# Patient Record
Sex: Female | Born: 1970 | Race: White | Hispanic: No | Smoking: Never smoker
Health system: Southern US, Community
[De-identification: ages and names within clinical notes are randomized; demographics above are authoritative.]

## PROBLEM LIST (undated history)

## (undated) DIAGNOSIS — R112 Nausea with vomiting, unspecified: Secondary | ICD-10-CM

## (undated) DIAGNOSIS — C801 Malignant (primary) neoplasm, unspecified: Secondary | ICD-10-CM

## (undated) DIAGNOSIS — F419 Anxiety disorder, unspecified: Secondary | ICD-10-CM

## (undated) DIAGNOSIS — Z8489 Family history of other specified conditions: Secondary | ICD-10-CM

## (undated) DIAGNOSIS — Q8789 Other specified congenital malformation syndromes, not elsewhere classified: Secondary | ICD-10-CM

## (undated) DIAGNOSIS — Z9889 Other specified postprocedural states: Secondary | ICD-10-CM

## (undated) DIAGNOSIS — K219 Gastro-esophageal reflux disease without esophagitis: Secondary | ICD-10-CM

## (undated) DIAGNOSIS — R519 Headache, unspecified: Secondary | ICD-10-CM

## (undated) DIAGNOSIS — J189 Pneumonia, unspecified organism: Secondary | ICD-10-CM

## (undated) DIAGNOSIS — J45909 Unspecified asthma, uncomplicated: Secondary | ICD-10-CM

## (undated) DIAGNOSIS — R51 Headache: Secondary | ICD-10-CM

## (undated) DIAGNOSIS — J328 Other chronic sinusitis: Secondary | ICD-10-CM

## (undated) HISTORY — PX: BOTOX INJECTION: SHX5754

## (undated) HISTORY — PX: BREAST SURGERY: SHX581

## (undated) HISTORY — PX: MELANOMA EXCISION: SHX5266

---

## 1994-08-24 HISTORY — PX: DIAGNOSTIC LAPAROSCOPY: SUR761

## 2001-08-24 HISTORY — PX: DIAGNOSTIC LAPAROSCOPY: SUR761

## 2003-08-25 HISTORY — PX: MYOMECTOMY: SHX85

## 2006-05-15 ENCOUNTER — Encounter: Admission: RE | Admit: 2006-05-15 | Discharge: 2006-05-15 | Payer: Self-pay | Admitting: Neurology

## 2006-09-17 ENCOUNTER — Emergency Department (HOSPITAL_COMMUNITY): Admission: EM | Admit: 2006-09-17 | Discharge: 2006-09-17 | Payer: Self-pay | Admitting: Emergency Medicine

## 2006-10-15 ENCOUNTER — Ambulatory Visit: Payer: Self-pay

## 2007-03-16 ENCOUNTER — Ambulatory Visit: Payer: Self-pay | Admitting: Unknown Physician Specialty

## 2007-05-23 ENCOUNTER — Ambulatory Visit: Payer: Self-pay | Admitting: Chiropractic Medicine

## 2007-09-26 ENCOUNTER — Ambulatory Visit: Payer: Self-pay | Admitting: Family Medicine

## 2007-12-16 ENCOUNTER — Ambulatory Visit: Payer: Self-pay | Admitting: Otolaryngology

## 2009-04-25 ENCOUNTER — Ambulatory Visit: Payer: Self-pay

## 2009-05-02 ENCOUNTER — Ambulatory Visit: Payer: Self-pay

## 2010-12-17 ENCOUNTER — Emergency Department: Payer: Self-pay | Admitting: Emergency Medicine

## 2011-02-17 ENCOUNTER — Encounter: Payer: Self-pay | Admitting: Internal Medicine

## 2011-02-22 ENCOUNTER — Encounter: Payer: Self-pay | Admitting: Internal Medicine

## 2011-03-11 ENCOUNTER — Ambulatory Visit: Payer: Self-pay | Admitting: Otolaryngology

## 2011-03-25 ENCOUNTER — Encounter: Payer: Self-pay | Admitting: Internal Medicine

## 2011-04-21 ENCOUNTER — Ambulatory Visit: Payer: Self-pay | Admitting: Family Medicine

## 2011-04-22 ENCOUNTER — Ambulatory Visit: Payer: Self-pay | Admitting: Family Medicine

## 2011-04-25 ENCOUNTER — Encounter: Payer: Self-pay | Admitting: Internal Medicine

## 2011-05-25 ENCOUNTER — Encounter: Payer: Self-pay | Admitting: Internal Medicine

## 2012-01-19 IMAGING — CT CT ABD-PELV W/O CM
1 of 2 series · 15 of 32 positions shown, 19 images · non-contrast
Comparison: none

REASON FOR EXAM: STAT CR [id number] hematuria back pain eval kidney
stones
COMMENTS:

[Series 2: stone 3.0 i40f 3 · axial · 0.78mm/px · z∈[-530,-167]mm · 15 of 136 slices shown, 19 images]
[im 10/136  soft-tissue]
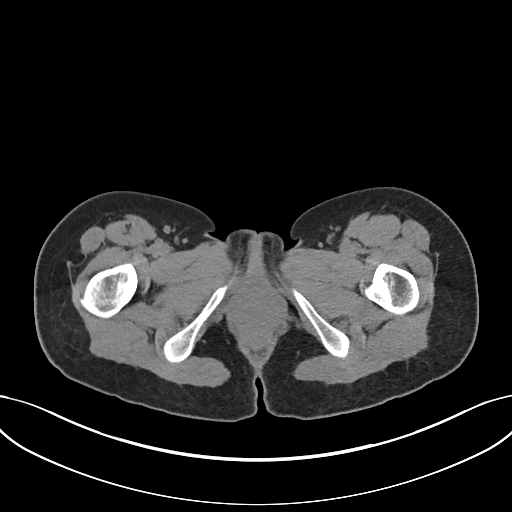
[im 10/136  bone]
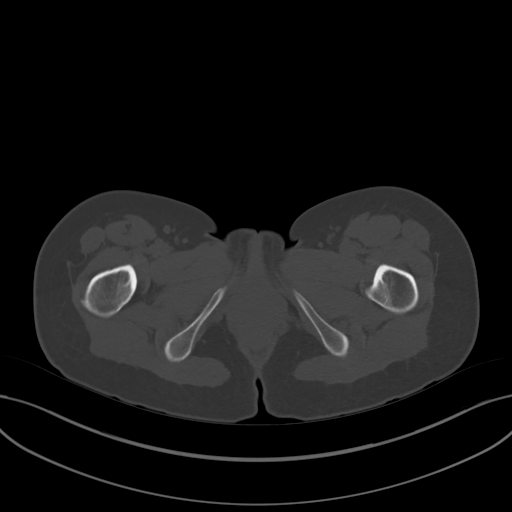
[im 20/136  soft-tissue]
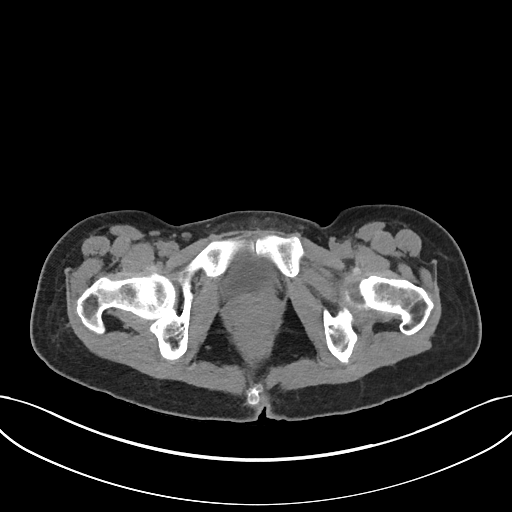
[im 29/136  soft-tissue]
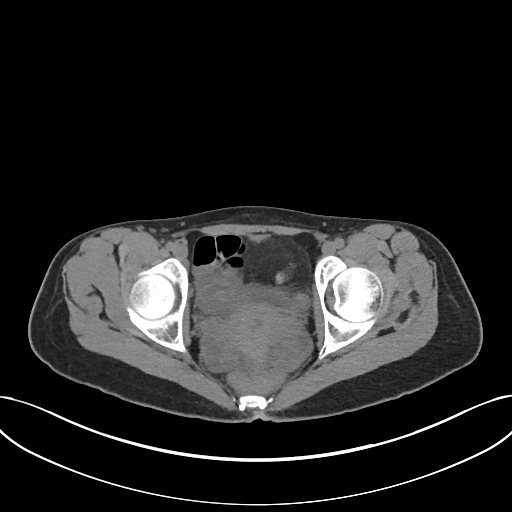
[im 39/136  soft-tissue]
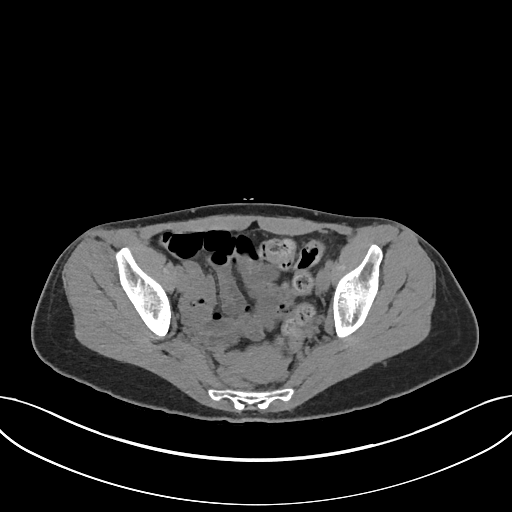
[im 49/136  soft-tissue]
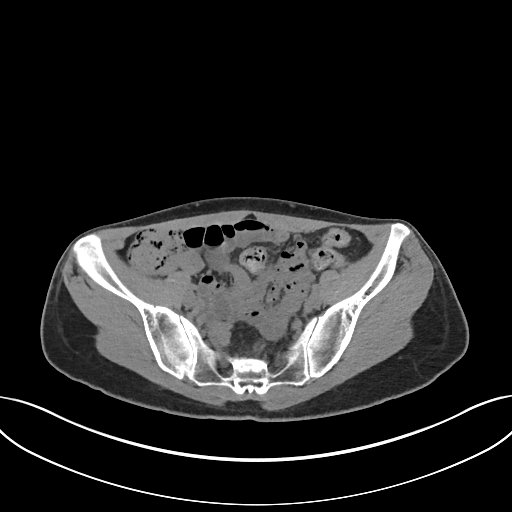
[im 58/136  soft-tissue]
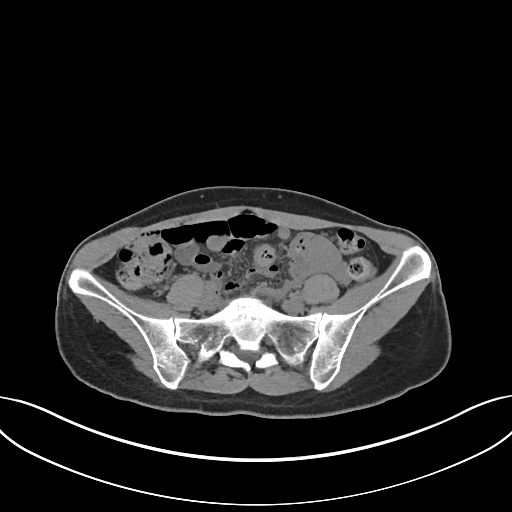
[im 68/136  soft-tissue]
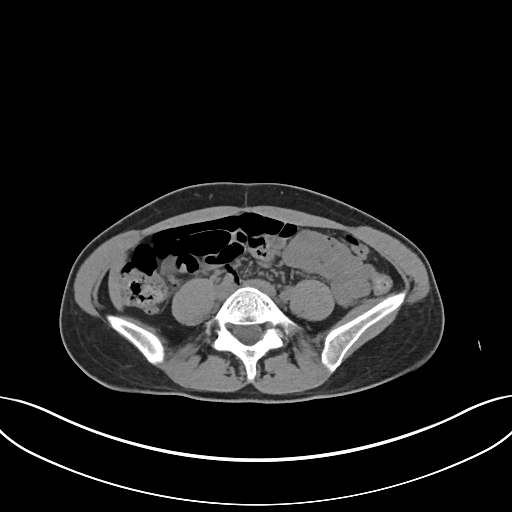
[im 78/136  soft-tissue]
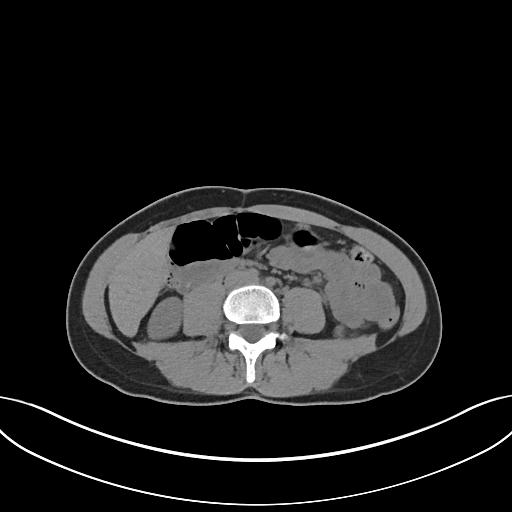
[im 87/136  soft-tissue]
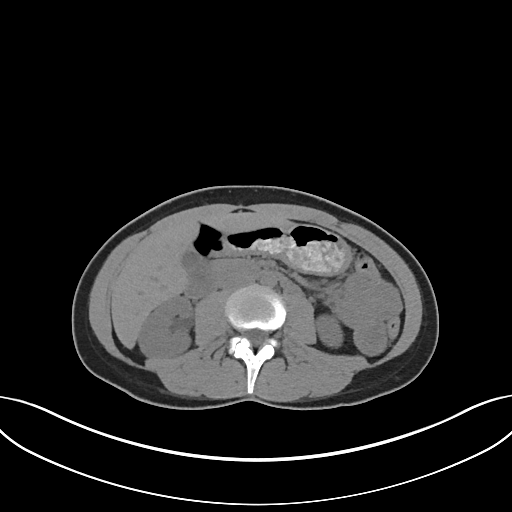
[im 87/136  bone]
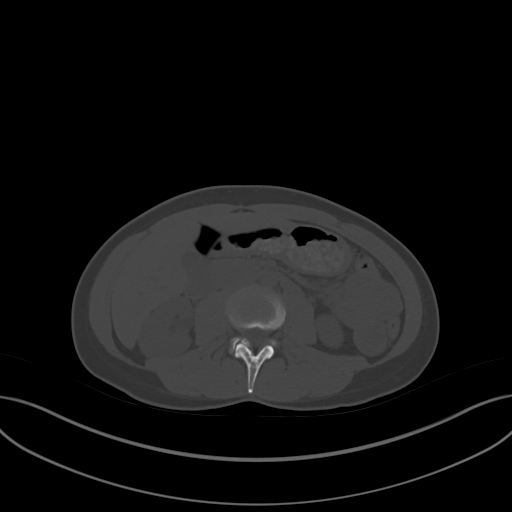
[im 97/136  soft-tissue]
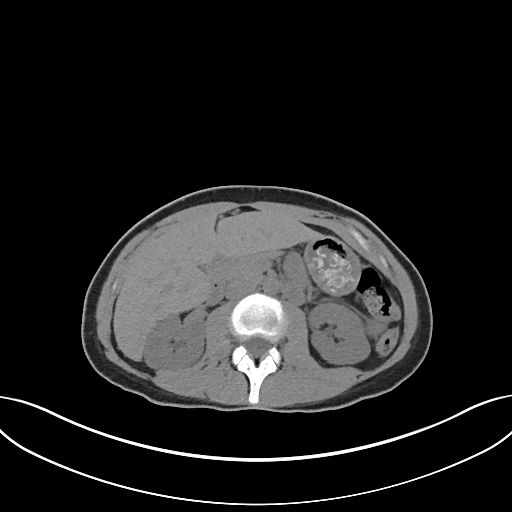
[im 107/136  soft-tissue]
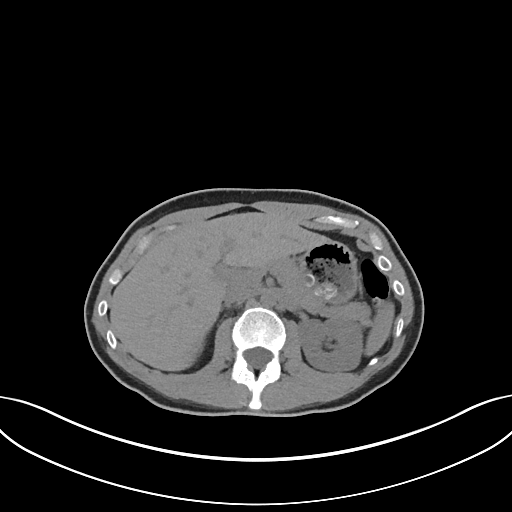
[im 116/136  soft-tissue]
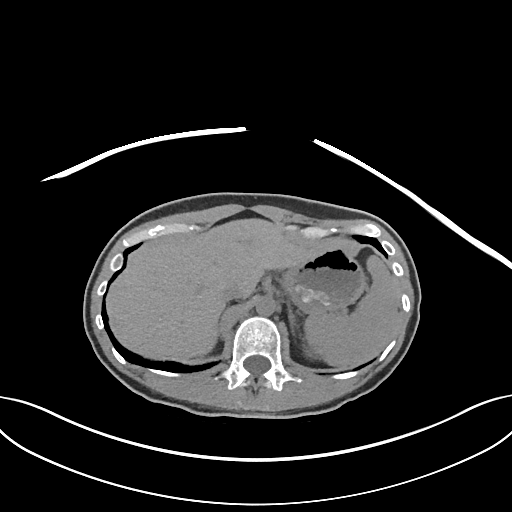
[im 116/136  lung]
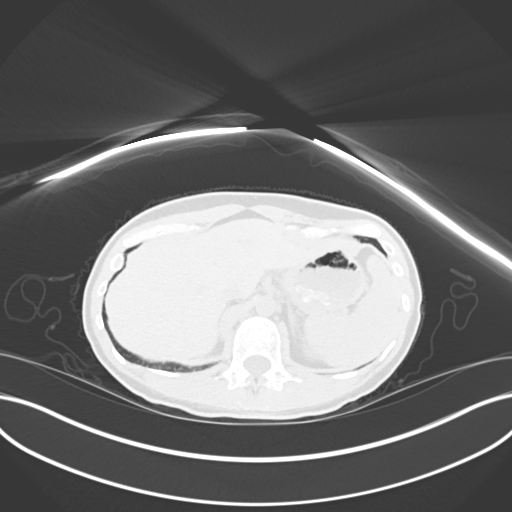
[im 121/136  lung]
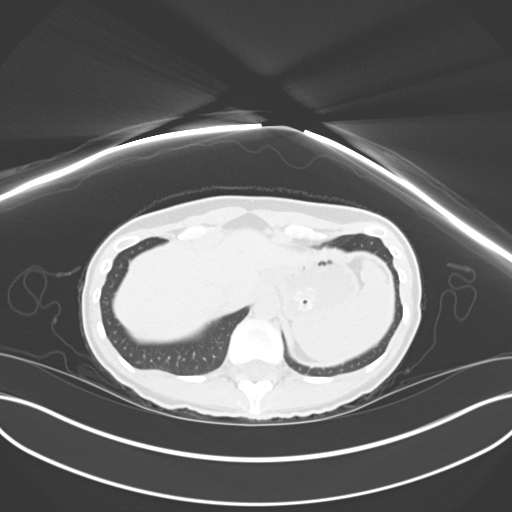
[im 126/136  soft-tissue]
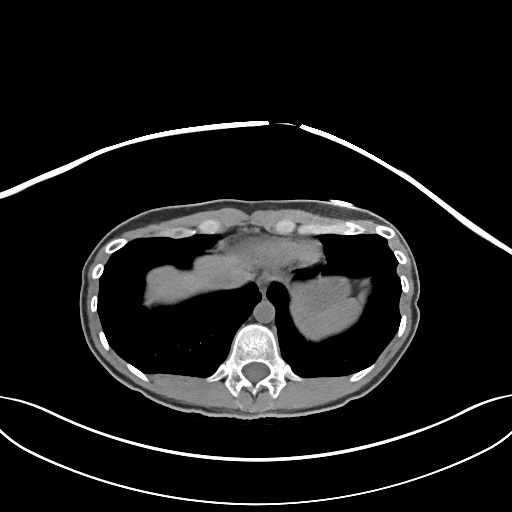
[im 126/136  lung]
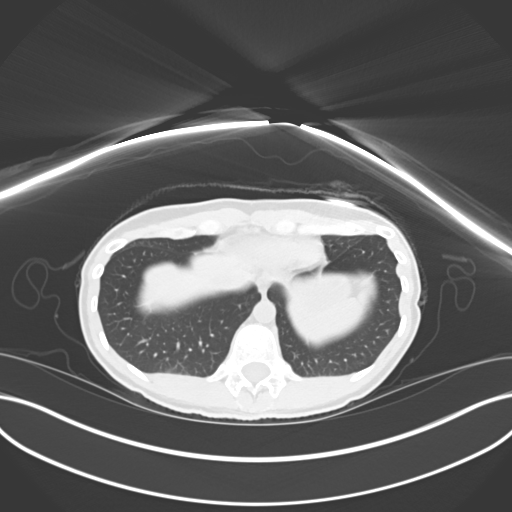
[im 131/136  lung]
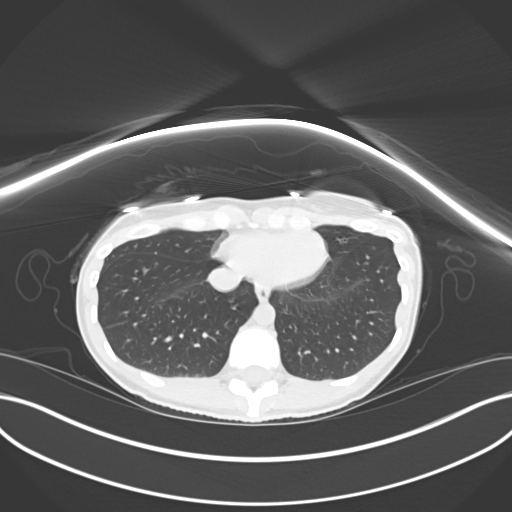

[15 of 32 positions shown; findings below may reference images not displayed]

PROCEDURE:     KCT - KCT ABDOMEN/PELVIS WO  - April 21, 2011  [DATE]

RESULT:     Axial noncontrast CT scanning was performed through the abdomen
and pelvis at 3 mm intervals and slice thicknesses. Review of multiplanar
reconstructed images was performed separately on the VIA monitor.

The study is limited without oral or intravenous contrast material. The
kidneys are normal in contour and exhibit no evidence of calcified stones
nor evidence of obstruction. The partially distended urinary bladder is
normal in appearance. There are loops of unopacified small bowel in the
pelvis but there do appear to be cystic adnexal processes bilaterally. I see
no free pelvic fluid. The uterus is grossly normal for the noncontrast study.

The liver, gallbladder, spleen, partially distended stomach, pancreas, and
adrenal glands are normal in appearance. There is radiodense material within
the stomach which may reflect medication. The caliber of the abdominal aorta
is normal. The lumbar vertebral bodies are preserved in height. The lung
bases exhibit emphysematous changes.
IMPRESSION: 1. I do not see evidence of urinary tract stones or urinary tract
obstruction. The perinephric fat is normal in density. No abnormality of the
urinary bladder is demonstrated.
2. There is soft tissue fullness in likely cystic adnexal processes
bilaterally. The uterus appears retroverted. Pelvic ultrasound is
recommended.
3. I see no evidence of bowel obstruction nor ileus. A normal appendix is
demonstrated.
4. I do not see acute hepatobiliary abnormality.

A preliminary report was called by me to do primary care and report left on
the triage nurse's voicemail at [DATE] p.m. on 21 April, 2011.

## 2012-01-20 IMAGING — US TRANSABDOMINAL ULTRASOUND OF PELVIS
1 series · 17 of 25 positions shown · non-contrast
Comparison: none

REASON FOR EXAM: pelvic pain
COMMENTS:

[Series 1: transabdominal ultrasound of pelvis · 17 of 32 slices shown]
[im 1/32]
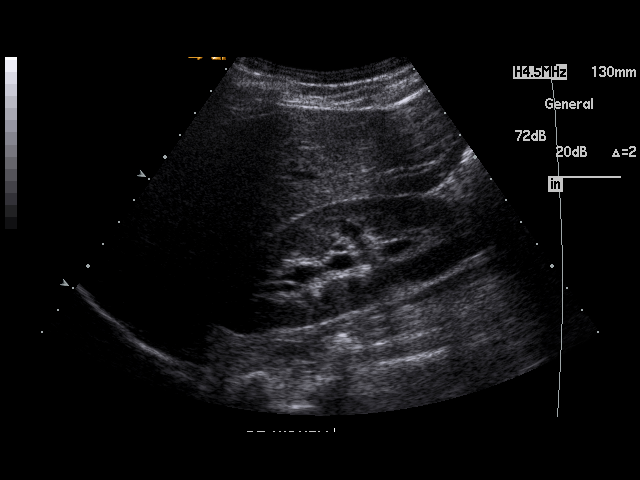
[im 3/32]
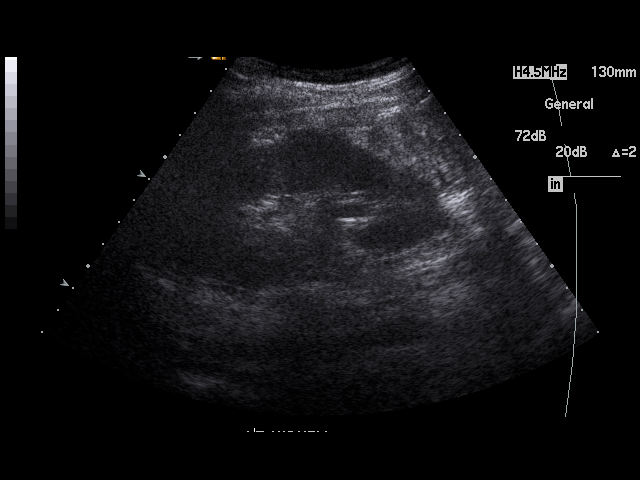
[im 4/32]
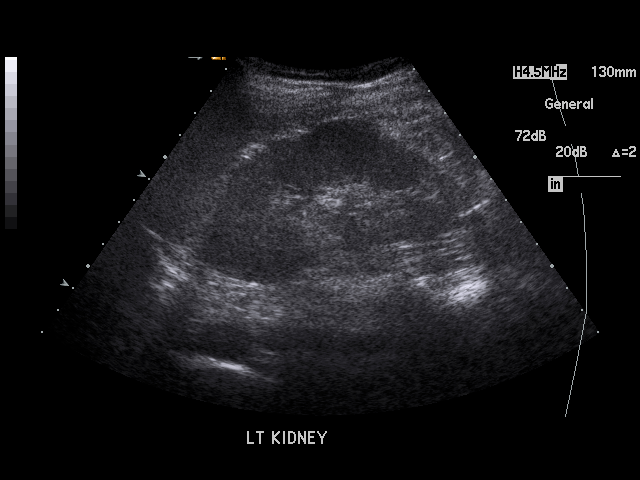
[im 7/32]
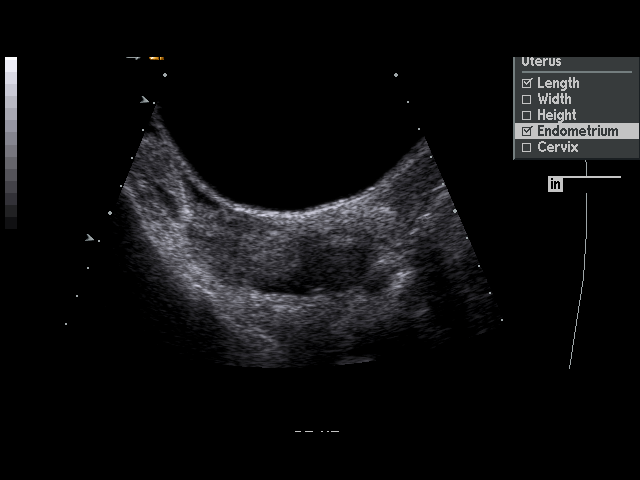
[im 8/32]
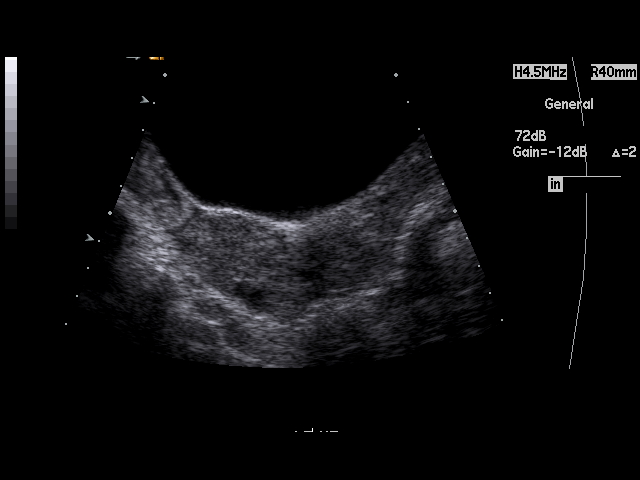
[im 11/32]
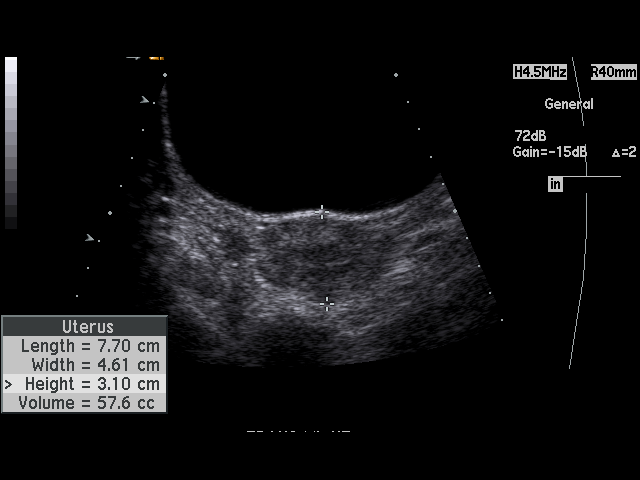
[im 12/32]
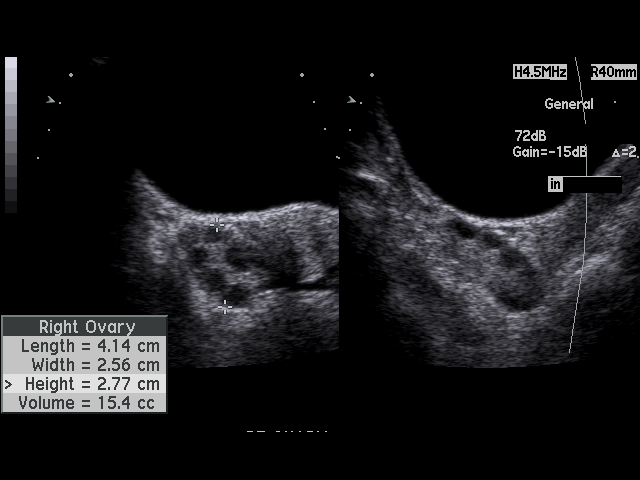
[im 15/32]
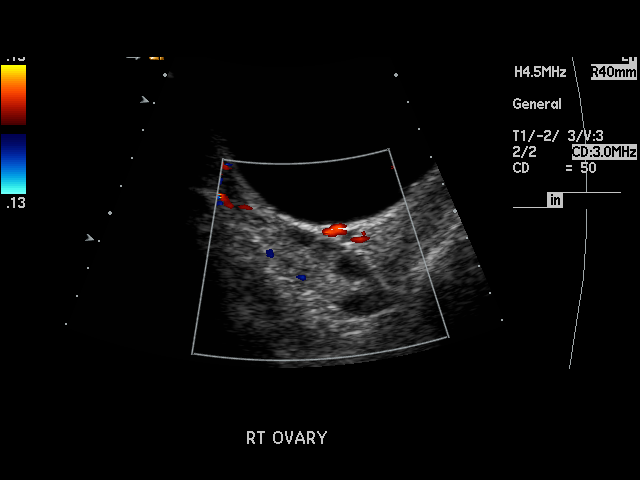
[im 16/32]
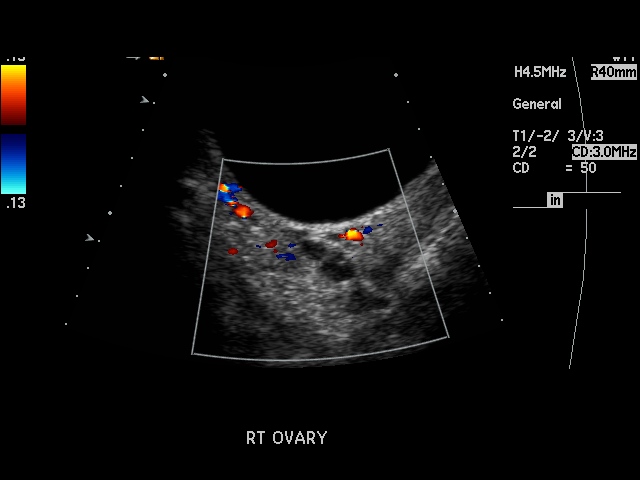
[im 17/32]
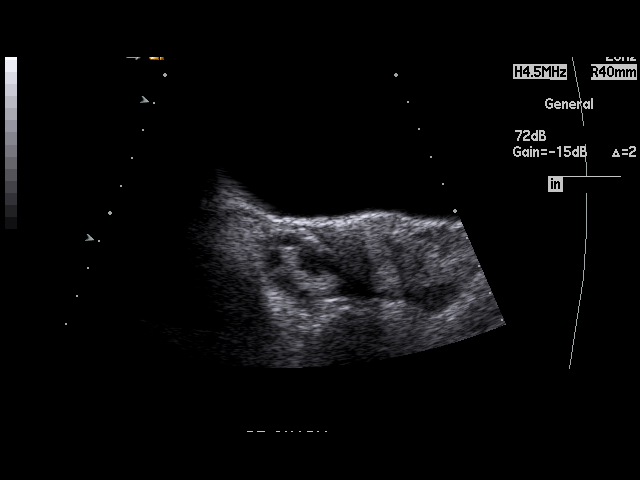
[im 20/32]
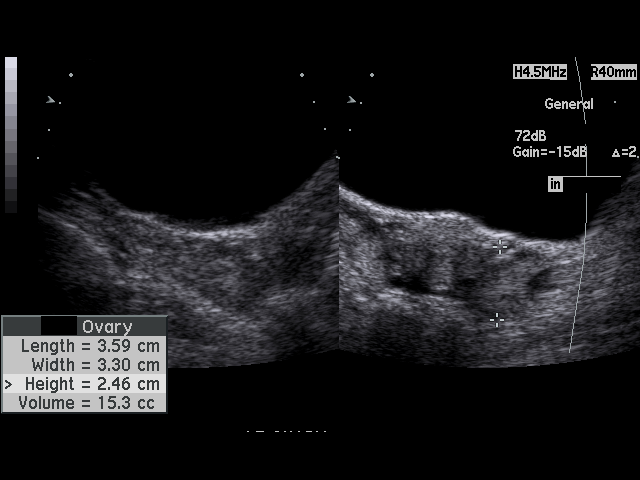
[im 21/32]
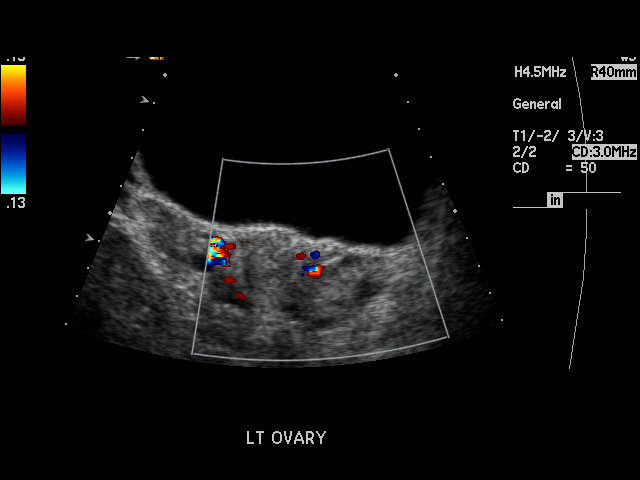
[im 24/32]
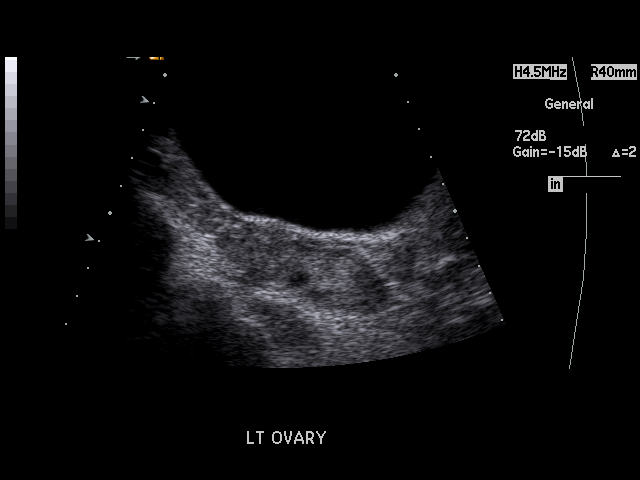
[im 25/32]
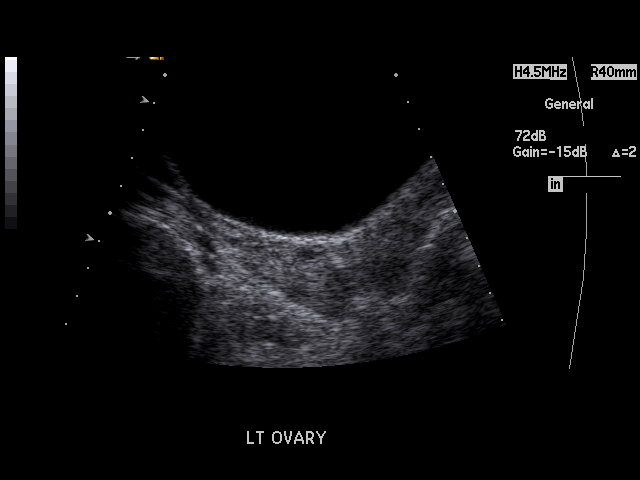
[im 28/32]
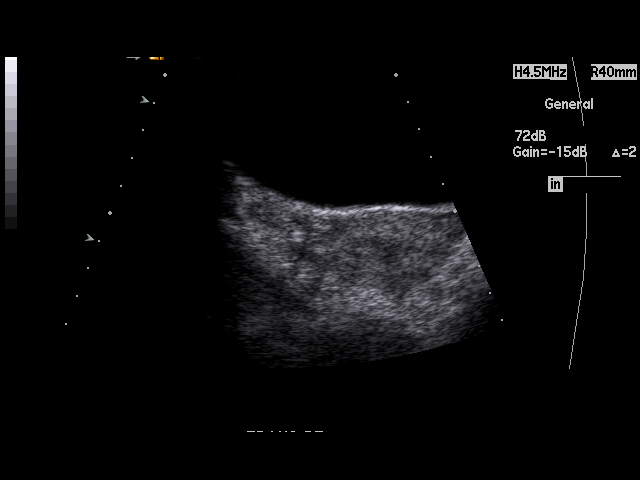
[im 29/32]
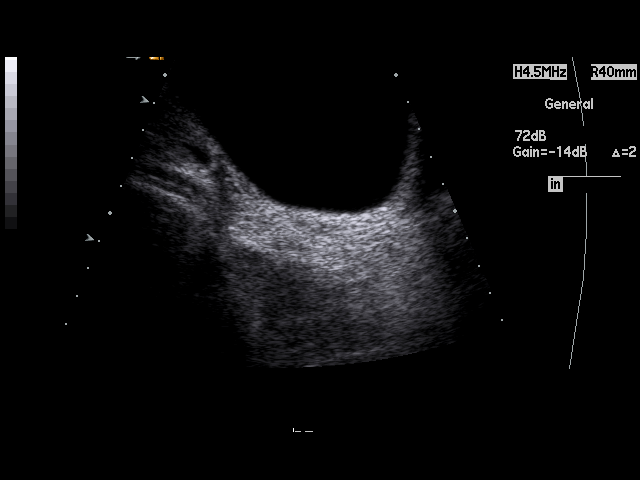
[im 32/32]
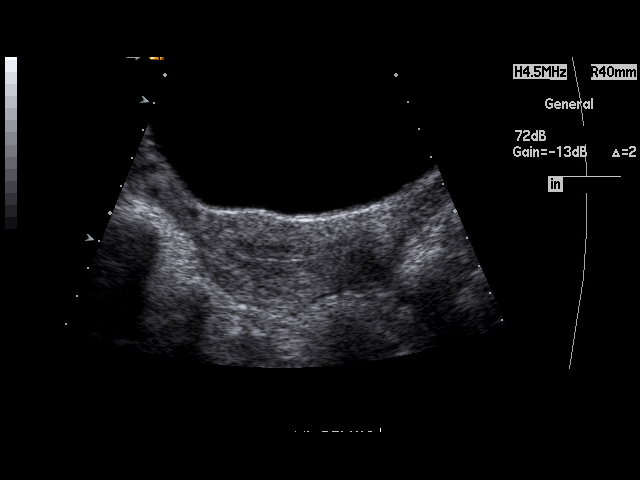

[17 of 25 positions shown; findings below may reference images not displayed]

PROCEDURE:     LAURENS - LAURENS PELVIS MASS EXAM  - [DATE] [DATE] [DATE]  [DATE]

RESULT:     Transabdominal pelvic ultrasound was performed. The uterus
measures 7.7 cm x 4.61 cm x 3.1 cm. No uterine mass is seen. The endometrium
measures 2.7 mm in thickness. The right and left ovaries are visualized. The
right ovary measures 4.14 cm at maximum diameter and the left ovary measures
3.59 cm at maximum diameter. Vascular flow is observed in each ovary on
Doppler examination. A few follicles are noted in each ovary. No abnormal
adnexal masses are seen. There is no free fluid noted in the pelvis. The
visualized portion of the urinary bladder reveals no significant
abnormalities. The kidneys show no hydronephrosis.
IMPRESSION: No significant abnormalities are noted.

## 2012-12-14 ENCOUNTER — Ambulatory Visit: Payer: Self-pay | Admitting: Family Medicine

## 2013-12-04 ENCOUNTER — Encounter

## 2014-01-11 NOTE — Other (Signed)
[facility]  Preoperative Instructions        Surgery Date 01/16/14          Time of Arrival 1100    1. On the day of your surgery, please report to the Surgical Services Registration Desk and sign in at your designated time. The Surgery Center is located to the right of the Emergency Room.     2. You must have someone with you to drive you home. You should not drive a car for 24 hours following surgery. Please make arrangements for a friend or family member to stay with you for the first 24 hours after your surgery.    3. Do not have anything to eat or drink (including water, gum, mints, coffee, juice) after midnight 01/15/14              . This may not apply to medications prescribed by your physician.  Please note special instructions, if applicable.  If you are currently taking Plavix, Coumadin, or other blood-thinning agents, contact your surgeon for instructions.    4. We recommend you do not drink any alcoholic beverages for 24 hours before and after your surgery.    5. Have a list of all current medications, including vitamins, herbal supplements and any other over the counter medications. Stop all Aspirin and non-steroidal anti-inflammatory drugs (I.e. Advil, Aleve), as directed by your surgeon's office. Stop all vitamins and herbal supplements seven days prior to your surgery.    6. Wear comfortable clothes.  Wear glasses instead of contacts.  Do not bring any money or jewelry. Please bring picture ID, insurance card, and any prearranged co-payment or hospital payment.  Do not wear make-up, particularly mascara the morning of your surgery.  Do not wear nail polish, particularly if you are having foot /hand surgery.  Wear your hair loose or down, no ponytails, buns, bobby pins or clips.  All body piercings must be removed.  Please shower with antibacterial soap for three consecutive days before and on the morning of surgery, but do not apply any lotions, powders or deodorants  after the shower on the day of surgery. Please use a fresh towels after each shower. Please sleep in clean clothes and change bed linens the night before surgery.  Please do not shave for 48 hours prior to surgery. Shaving of the face is acceptable.    7. You should understand that if you do not follow these instructions your surgery may be cancelled.  If your physical condition changes (I.e. fever, cold or flu) please contact your surgeon as soon as possible.    8. It is important that you be on time.  If a situation occurs where you may be late, please call [phone] (OR Holding Area).    9. If you have any questions and or problems, please call [phone] (Pre-admission Testing).    10. Your surgery time may be subject to change.  You will receive a phone call the evening prior if your time changes.    11.  If having outpatient surgery, you must have someone to drive you here, stay with you during the duration of your stay, and to drive you home at time of discharge.        Special Instructions:    MEDICATIONS TO TAKE THE MORNING OF SURGERY WITH A SIP OF WATER: none      I understand a pre-operative phone call will be made to verify my surgery time.  In  the event that I am not available, I give permission for a message to be left on my answering service and/or with another person?  Yes [phone]         ___________________      __________   _________    (Signature of Patient)             (Witness)                (Date and Time)

## 2014-01-12 MED ORDER — CEFAZOLIN 2 G IN 100 ML 0.9% NS
2 gram/100 mL | Freq: Once | INTRAVENOUS | Status: DC
Start: 2014-01-12 — End: 2014-01-12

## 2014-01-12 NOTE — Other (Signed)
Called Toni Trujillo and informed of pt HX of MRSA. Toni Trujillo will flag chart DOS. She advised pt should get a nares culture done DOS and should be placed on contact precautions.

## 2014-01-16 ENCOUNTER — Inpatient Hospital Stay: Payer: PRIVATE HEALTH INSURANCE

## 2014-01-16 LAB — HCG URINE, QL. - POC: Pregnancy test,urine (POC): NEGATIVE

## 2014-01-16 LAB — EKG, 12 LEAD, INITIAL
Atrial Rate: 64 {beats}/min
Calculated P Axis: 49 degrees
Calculated R Axis: 26 degrees
Calculated T Axis: 29 degrees
P-R Interval: 134 ms
Q-T Interval: 384 ms
QRS Duration: 80 ms
QTC Calculation (Bezet): 396 ms
Ventricular Rate: 64 {beats}/min

## 2014-01-16 MED ORDER — FENTANYL CITRATE (PF) 50 MCG/ML IJ SOLN
50 mcg/mL | INTRAMUSCULAR | Status: DC | PRN
Start: 2014-01-16 — End: 2014-01-16

## 2014-01-16 MED ORDER — FENTANYL CITRATE (PF) 50 MCG/ML IJ SOLN
50 mcg/mL | INTRAMUSCULAR | Status: DC | PRN
Start: 2014-01-16 — End: 2014-01-16
  Administered 2014-01-16: 18:00:00 via INTRAVENOUS

## 2014-01-16 MED ORDER — HYDROMORPHONE (PF) 1 MG/ML IJ SOLN
1 mg/mL | INTRAMUSCULAR | Status: DC | PRN
Start: 2014-01-16 — End: 2014-01-16

## 2014-01-16 MED ORDER — MIDAZOLAM 1 MG/ML IJ SOLN
1 mg/mL | INTRAMUSCULAR | Status: DC | PRN
Start: 2014-01-16 — End: 2014-01-16

## 2014-01-16 MED ORDER — ONDANSETRON (PF) 4 MG/2 ML INJECTION
4 mg/2 mL | INTRAMUSCULAR | Status: DC | PRN
Start: 2014-01-16 — End: 2014-01-16
  Administered 2014-01-16: 17:00:00 via INTRAVENOUS

## 2014-01-16 MED ORDER — MORPHINE 10 MG/ML INJ SOLUTION
10 mg/ml | INTRAMUSCULAR | Status: DC | PRN
Start: 2014-01-16 — End: 2014-01-16

## 2014-01-16 MED ORDER — EPHEDRINE SULFATE 50 MG/ML IJ SOLN
50 mg/mL | INTRAMUSCULAR | Status: DC | PRN
Start: 2014-01-16 — End: 2014-01-16

## 2014-01-16 MED ORDER — DEXAMETHASONE SODIUM PHOSPHATE 4 MG/ML IJ SOLN
4 mg/mL | Freq: Once | INTRAMUSCULAR | Status: DC | PRN
Start: 2014-01-16 — End: 2014-01-16

## 2014-01-16 MED ORDER — DIPHENHYDRAMINE HCL 50 MG/ML IJ SOLN
50 mg/mL | INTRAMUSCULAR | Status: DC | PRN
Start: 2014-01-16 — End: 2014-01-16

## 2014-01-16 MED ORDER — SODIUM CHLORIDE 0.9 % IJ SYRG
INTRAMUSCULAR | Status: DC | PRN
Start: 2014-01-16 — End: 2014-01-16

## 2014-01-16 MED ORDER — LIDOCAINE (PF) 10 MG/ML (1 %) IJ SOLN
10 mg/mL (1 %) | INTRAMUSCULAR | Status: DC | PRN
Start: 2014-01-16 — End: 2014-01-16

## 2014-01-16 MED ORDER — SODIUM CHLORIDE 0.9 % IJ SYRG
Freq: Three times a day (TID) | INTRAMUSCULAR | Status: DC
Start: 2014-01-16 — End: 2014-01-16

## 2014-01-16 MED ORDER — DEXAMETHASONE SODIUM PHOSPHATE 10 MG/ML IJ SOLN
10 mg/mL | Freq: Once | INTRAMUSCULAR | Status: AC
Start: 2014-01-16 — End: 2014-01-16
  Administered 2014-01-16: 14:00:00 via INTRAVENOUS

## 2014-01-16 MED ORDER — OXYMETAZOLINE 0.05 % NASAL SPRAY AEROSOL
0.05 % | Freq: Two times a day (BID) | NASAL | Status: DC | PRN
Start: 2014-01-16 — End: 2014-01-16
  Administered 2014-01-16: 14:00:00 via NASAL

## 2014-01-16 MED ADMIN — fentaNYL citrate (PF) injection: INTRAVENOUS | @ 15:00:00 | NDC 00409909332

## 2014-01-16 MED ADMIN — midazolam (VERSED) injection: INTRAVENOUS | @ 15:00:00 | NDC 10019002836

## 2014-01-16 MED ADMIN — oxymetazoline (AFRIN) 0.05 % nasal spray: NASAL | @ 17:00:00 | NDC 45802041059

## 2014-01-16 MED ADMIN — lactated ringers infusion: INTRAVENOUS | @ 15:00:00 | NDC 11845118709

## 2014-01-16 MED ADMIN — neostigmine (PROSTIGMINE) injection: INTRAVENOUS | @ 17:00:00 | NDC 10019027010

## 2014-01-16 MED ADMIN — ceFAZolin (ANCEF) 2g in 100 ml 0.9% NS IVPB: INTRAVENOUS | @ 15:00:00 | NDC 99990004319

## 2014-01-16 MED ADMIN — bacitracin (BACITRACIN) 500 unit/gram ointment: TOPICAL | @ 16:00:00 | NDC 45802006001

## 2014-01-16 MED ADMIN — gelatin adsorbable (GELFILM) ophthalmic film: NASAL | @ 17:00:00

## 2014-01-16 MED ADMIN — triamcinolone acetonide (KENALOG) 0.1 % cream: TOPICAL | @ 17:00:00 | NDC 00168000415

## 2014-01-16 MED ADMIN — rocuronium (ZEMURON) injection: INTRAVENOUS | @ 15:00:00 | NDC 67457022805

## 2014-01-16 MED ADMIN — ondansetron (ZOFRAN) injection: INTRAVENOUS | @ 15:00:00 | NDC 00641607801

## 2014-01-16 MED ADMIN — lactated ringers infusion: INTRAVENOUS | @ 16:00:00 | NDC 00338011704

## 2014-01-16 MED ADMIN — oxymetazoline (AFRIN) 0.05 % nasal spray: NASAL | @ 16:00:00 | NDC 45802041059

## 2014-01-16 MED ADMIN — propofol (DIPRIVAN) 10 mg/mL injection: INTRAVENOUS | @ 15:00:00 | NDC 00703285903

## 2014-01-16 MED ADMIN — dexamethasone (DECADRON) 4 mg/mL injection: INTRAVENOUS | @ 15:00:00 | NDC 67457042200

## 2014-01-16 MED ADMIN — glycopyrrolate (ROBINUL) injection: INTRAVENOUS | @ 17:00:00 | NDC 10019001639

## 2014-01-16 MED ADMIN — gentamicin (GARAMYCIN) 0.1 % cream: TOPICAL | @ 17:00:00 | NDC 00168007115

## 2014-01-16 MED ADMIN — lidocaine (PF) (XYLOCAINE) 20 mg/mL (2 %) injection: INTRAVENOUS | @ 15:00:00 | NDC 63323020805

## 2014-01-16 MED ADMIN — lidocaine-EPINEPHrine (XYLOCAINE) 1 %-1:100,000 injection: SUBCUTANEOUS | @ 16:00:00 | NDC 00409317801

## 2014-01-16 MED ADMIN — lactated ringers infusion: INTRAVENOUS | @ 14:00:00 | NDC 00409795309

## 2014-01-16 MED ADMIN — acetaminophen (OFIRMEV) infusion: INTRAVENOUS | @ 15:00:00 | NDC 43825010201

## 2014-01-16 MED FILL — PROPOFOL 10 MG/ML IV EMUL: 10 mg/mL | INTRAVENOUS | Qty: 20

## 2014-01-16 MED FILL — MIDAZOLAM 1 MG/ML IJ SOLN: 1 mg/mL | INTRAMUSCULAR | Qty: 2

## 2014-01-16 MED FILL — LIDOCAINE (PF) 20 MG/ML (2 %) IJ SOLN: 20 mg/mL (2 %) | INTRAMUSCULAR | Qty: 5

## 2014-01-16 MED FILL — FENTANYL CITRATE (PF) 50 MCG/ML IJ SOLN: 50 mcg/mL | INTRAMUSCULAR | Qty: 2

## 2014-01-16 MED FILL — FENTANYL CITRATE (PF) 50 MCG/ML IJ SOLN: 50 mcg/mL | INTRAMUSCULAR | Qty: 5

## 2014-01-16 MED FILL — NASAL SPRAY (OXYMETAZOLINE) 0.05 %: 0.05 % | NASAL | Qty: 30

## 2014-01-16 MED FILL — SODIUM CHLORIDE 0.9 % INJECTION: INTRAMUSCULAR | Qty: 10

## 2014-01-16 MED FILL — SODIUM CHLORIDE 0.9 % IV: INTRAVENOUS | Qty: 1000

## 2014-01-16 MED FILL — NEOSTIGMINE METHYLSULFATE 1 MG/ML INJECTION: 1 mg/mL | INTRAMUSCULAR | Qty: 10

## 2014-01-16 MED FILL — LACTATED RINGERS IV: INTRAVENOUS | Qty: 1000

## 2014-01-16 MED FILL — TRANSDERM-SCOP 1 MG OVER 3 DAYS TRANSDERMAL PATCH: 1 mg over 3 days | TRANSDERMAL | Qty: 1

## 2014-01-16 MED FILL — ONDANSETRON (PF) 4 MG/2 ML INJECTION: 4 mg/2 mL | INTRAMUSCULAR | Qty: 2

## 2014-01-16 MED FILL — CEFAZOLIN 2 G IN 100 ML 0.9% NS: 2 gram/100 mL | INTRAVENOUS | Qty: 100

## 2014-01-16 MED FILL — DEXAMETHASONE SODIUM PHOSPHATE 10 MG/ML IJ SOLN: 10 mg/mL | INTRAMUSCULAR | Qty: 1

## 2014-01-16 MED FILL — ROCURONIUM 10 MG/ML IV: 10 mg/mL | INTRAVENOUS | Qty: 10

## 2014-01-16 MED FILL — GLYCOPYRROLATE 0.2 MG/ML IJ SOLN: 0.2 mg/mL | INTRAMUSCULAR | Qty: 5

## 2014-01-16 MED FILL — OFIRMEV 1,000 MG/100 ML (10 MG/ML) INTRAVENOUS SOLUTION: 1000 mg/100 mL (10 mg/mL) | INTRAVENOUS | Qty: 100

## 2014-01-16 NOTE — Brief Op Note (Signed)
BRIEF OPERATIVE NOTE    Date of Procedure: 01/16/2014   Preoperative Diagnosis: SINUSITIS  Postoperative Diagnosis: SINUSITIS    Procedure(s):  ENDOSCOPIC WITH IMAGE GUIDANCE, REVISION BILATERAL ETHMOIDECTOMY, EXPLORATION NASO FRONTAL DUCT WITH BALLOON SINUPLASTY  Surgeon(s) and Role:     * Naida Sleight, MD - Primary  Anesthesia: General   Estimated Blood Loss: 20 ml  Specimens: * No specimens in log *   Findings: diffuse membrane edema, polyposis occupying the ethmoid and nasofrontal duct region bilaterally   Complications: none apparent  Implants: * No implants in log *

## 2014-01-16 NOTE — Anesthesia Post-Procedure Evaluation (Signed)
Post-Anesthesia Evaluation and Assessment    Patient: Toni Trujillo MRN: [id number]  SSN: [id number]    Date of Birth: 1971/06/18  Age: 43 y.o.  Sex: female       Cardiovascular Function/Vital Signs  Visit Vitals   Item Reading   ??? BP 150/85   ??? Pulse 86   ??? Temp 36.3 ??C (97.3 ??F)   ??? Resp 13   ??? Ht 5\' 5"  (1.651 m)   ??? Wt 80.372 kg (177 lb 3 oz)   ??? BMI 29.49 kg/m2   ??? SpO2 97%       Patient is status post general anesthesia for Procedure(s):  ENDOSCOPIC WITH IMAGE GUIDANCE, REVISION BILATERAL ETHMOIDECTOMY, EXPLORATION NASO FRONTAL DUCT, BALLOON SINUPLASTY.    Nausea/Vomiting: None    Postoperative hydration reviewed and adequate.    Pain:  Pain Scale 1: Visual (01/16/14 1400)  Pain Intensity 1: 0 (pt resting eyes closed no moaning) (01/16/14 1400)   Managed    Neurological Status:   Neuro (WDL): Exceptions to WDL (01/16/14 1258)  Neuro  Neurologic State: Drowsy;Eyes open spontaneously;Eyes open to voice (01/16/14 1258)  Orientation Level: Oriented to person (01/16/14 1258)  LUE Motor Response: Purposeful (01/16/14 1258)  LLE Motor Response: Purposeful (01/16/14 1258)  RUE Motor Response: Purposeful (01/16/14 1258)  RLE Motor Response: Purposeful (01/16/14 1258)   At baseline    Mental Status and Level of Consciousness: Alert and oriented     Pulmonary Status:   O2 Device: Room air (01/16/14 1400)   Adequate oxygenation and airway patent    Complications related to anesthesia: None    Post-anesthesia assessment completed. No concerns    Signed By: Nunzio Cobbs, DO     Jan 16, 2014

## 2014-01-16 NOTE — Other (Signed)
EKG obtained per Dr. Alvina Filbert (anesthesia), placed copy in chart and transmitted.

## 2014-01-16 NOTE — Other (Signed)
Updated patient's mother in waiting room

## 2014-01-16 NOTE — Other (Signed)
Urine pregnancy test negative

## 2014-01-16 NOTE — Other (Signed)
Handoff Report from Operating Room to PACU    Report received from A. Vickey Sages, RN and SCRNA and A. Glor, CRNA regarding Toni Trujillo.      Surgeon(s):  Naida Sleight, MD  And Procedure(s) (LRB):  ENDOSCOPIC WITH IMAGE GUIDANCE, REVISION BILATERAL ETHMOIDECTOMY, EXPLORATION NASO FRONTAL DUCT, BALLOON SINUPLASTY (Bilateral)  confirmed   with allergies, drains and dressings discussed.    Anesthesia type, drugs, patient history, complications, estimated blood loss, vital signs, intake and output, and last pain medication, lines, reversal medications and temperature were reviewed.

## 2014-01-16 NOTE — Anesthesia Pre-Procedure Evaluation (Addendum)
Anesthetic History   No history of anesthetic complications           Review of Systems / Medical History  Patient summary reviewed, nursing notes reviewed and pertinent labs reviewed    Pulmonary          Asthma    Comments: HX HEENT (HCW237) sinus    Neuro/Psych   Within defined limits           Cardiovascular            Dysrhythmias    Exercise tolerance: >4 METS  Comments: H/O irregular heart rate   GI/Hepatic/Renal  Within defined limits                Endo/Other        Obesity     Other Findings   Comments: Denies pregnancy          Physical Exam    Airway  Mallampati: II  TM Distance: > 6 cm  Neck ROM: normal range of motion   Mouth opening: Normal     Cardiovascular  Regular rate and rhythm,  S1 and S2 normal,  no murmur, click, rub, or gallop  Rhythm: regular  Rate: normal         Dental      Comments: permanent retainer    Pulmonary  Breath sounds clear to auscultation               Abdominal  GI exam deferred       Other Findings            Anesthetic Plan    ASA: 2  Anesthesia type: general          Induction: Intravenous  Anesthetic plan and risks discussed with: Patient

## 2014-01-16 NOTE — Other (Signed)
TRANSFER - OUT REPORT:    Verbal report given to April, RN(name) on Toni Trujillo  being transferred to Phase II(unit) for routine progression of care       Report consisted of patient???s Situation, Background, Assessment and   Recommendations(SBAR).     Information from the following report(s) SBAR, OR Summary, Procedure Summary, Intake/Output and MAR was reviewed with the receiving nurse.    Opportunity for questions and clarification was provided.      Patient transported with:   Registered Nurse

## 2014-01-16 NOTE — Other (Signed)
MRSA nares swab obtained and sent to the lab

## 2014-01-17 LAB — CULTURE, MRSA

## 2014-01-17 MED FILL — NEOSTIGMINE METHYLSULFATE 1 MG/ML INJECTION: 1 mg/mL | INTRAMUSCULAR | Qty: 10

## 2014-01-17 MED FILL — ROCURONIUM 10 MG/ML IV: 10 mg/mL | INTRAVENOUS | Qty: 10

## 2014-01-17 MED FILL — LIDOCAINE-EPINEPHRINE 1 %-1:100,000 IJ SOLN: 1 %-:00,000 | INTRAMUSCULAR | Qty: 20

## 2014-01-17 MED FILL — GLYCOPYRROLATE 0.2 MG/ML IJ SOLN: 0.2 mg/mL | INTRAMUSCULAR | Qty: 2

## 2014-01-17 MED FILL — LIDOCAINE (PF) 20 MG/ML (2 %) IJ SOLN: 20 mg/mL (2 %) | INTRAMUSCULAR | Qty: 5

## 2014-01-17 MED FILL — GELFILM FOR THE EYE: OPHTHALMIC | Qty: 2

## 2014-01-17 MED FILL — TRIAMCINOLONE ACETONIDE 0.1 % TOPICAL CREAM: 0.1 % | CUTANEOUS | Qty: 15

## 2014-01-17 MED FILL — DIPRIVAN 10 MG/ML INTRAVENOUS EMULSION: 10 mg/mL | INTRAVENOUS | Qty: 20

## 2014-01-17 MED FILL — NASAL SPRAY (OXYMETAZOLINE) 0.05 %: 0.05 % | NASAL | Qty: 30

## 2014-01-17 MED FILL — OFIRMEV 1,000 MG/100 ML (10 MG/ML) INTRAVENOUS SOLUTION: 1000 mg/100 mL (10 mg/mL) | INTRAVENOUS | Qty: 100

## 2014-01-17 MED FILL — DEXAMETHASONE SODIUM PHOSPHATE 4 MG/ML IJ SOLN: 4 mg/mL | INTRAMUSCULAR | Qty: 5

## 2014-01-17 MED FILL — BACITRACIN 500 UNIT/G OINTMENT: 500 unit/gram | CUTANEOUS | Qty: 14

## 2014-01-17 MED FILL — ONDANSETRON (PF) 4 MG/2 ML INJECTION: 4 mg/2 mL | INTRAMUSCULAR | Qty: 2

## 2014-01-17 MED FILL — CEFAZOLIN 2 G IN 100 ML 0.9% NS: 2 gram/100 mL | INTRAVENOUS | Qty: 100

## 2014-01-17 MED FILL — GENTAMICIN 0.1 % TOPICAL CREAM: 0.1 % | CUTANEOUS | Qty: 15

## 2014-01-17 NOTE — Op Note (Signed)
Name:      Ruehl-BROWN, Imunique DENISE                                          Surgeon:        Gailya Tauer Scott Qadir Folks, MD  Account #: [id number]                 Surgery Date:   01/16/2014  DOB:       10/06/1970  Age:       42                           Location:                                 OPERATIVE REPORT      ESTIMATED BLOOD LOSS:   20 mL.    SPECIMENS REMOVED: None forwarded to pathology.    PREOPERATIVE DIAGNOSIS:  1. Chronic pansinusitis.  2. Intranasal polyposis.    POSTOPERATIVE DIAGNOSES:  1. Chronic pansinusitis.  2. Intranasal polyposis.    PROCEDURES PERFORMED: Bilateral endoscopic image guidance revision  ethmoidectomy.  Bilateral image-guidance endoscopic exploration of  nasofrontal duct with balloon sinuplasty.    INDICATIONS: The patient is a 42-year-old female with a prior history of  chronic sinusitis over many years. The patient has undergone 2 previous  endoscopic sinus surgeries with significant improvement on each of these  occasions.  She returns at this time with recurrent symptoms of sinusitis  and on recent CT of the paranasal sinuses recurrence of intranasal  polyposis with obstruction of the nasofrontal duct and evidence of chronic  pansinusitis were noted. As her symptoms have been refractory to medical  therapy, revision sinus surgery is discussed. Preoperatively, the  alternatives, potential benefits, and possible risks of the procedure were  explained to the patient, including CSF leak, as well as orbital  complications and the more typical risks of bleeding, infection, recurrence  of polyposis and scarring. The patient understood these and requested to  proceed.    DESCRIPTION OF PROCEDURE: The patient received 0.25% oxymetazoline in the  pre-anesthesia holding area as an intranasal spray. The patient was brought  to the operating room, placed supine on the operating table, and following  the smooth induction of general endotracheal tube anesthesia, the table was  turned 90 degrees  and the patient was positioned for operation. Tegaderm  eye coverings were placed. A routine Betadine prep and drape was carried  out. Further oxymetazoline on neurosurgical cottonoids was placed. A  routine prep and drape was completed. The Xomed image guidance system was  applied and tested and found to function satisfactorily.    Using a combination of 0-degree and 30-degree nasal telescopes, the nasal  cavity was examined. Xylocaine 1% with 1: 100,000 epinephrine was injected  in the region of the middle turbinate, middle meatus and sphenopalatine  artery bilaterally. After satisfactory vasoconstriction was observed,  attention was turned to the left nasal cavity. The image guidance system  was used to localize the regions of the nasal lacrimal duct, the fovea  ethmoidalis and lamina papyracea through the process of revision left  ethmoidectomy. The image guidance system was likewise used to locate the  inferior aspect of the nasofrontal duct.  The balloon dilation sinuplasty  equipment was   prepared and the light catheter was passed into the left  frontal sinus to transilluminate the frontal bone. Once satisfactory  placement of the light catheter was confirmed, the balloon was passed over  the light catheter and inflated superiorly and inferiorly to a pressure of  8 atmospheres for 15 seconds at 2 intervals. The nasofrontal duct was  subsequently rather easy to cannulate. The polyps which were seen to arise  inferiorly were removed using the straight shot and a satisfactory nasofrontal duct passage was  confirmed endoscopically. Once the left ethmoid was relieved of recurrent  polyp disease with identification and preservation of the lamina papyracea  and fovea ethmoidalis through the course of dissection, the remaining left  nasal cavity was inspected. Mucosal edema was noted; however, no other  obstructive lesions could be identified. Attention was then turned to the  contralateral side and in nearly  identical fashion, the right nasofrontal  duct was cannulated with the light catheter and a subsequent balloon  sinuplasty was carried out superiorly and inferiorly. Polyps were removed  prior to the balloon sinuplasty from the region of the inferior nasofrontal  duct. When this was completed, the image guidance system was again utilized  to allow identification and preservation of the right nasolacrimal duct,  fovea ethmoidalis and lamina papyracea through the course of revision right  Ethmoidectomy using the straight shot. A satisfactory sinus cavity was observed bilaterally.  The  maxillary sinuses were inspected and found to have clear and patent  antrostomies bilaterally. The sphenoidostomies, from previous surgeries  were likewise confirmed patent. The remaining nasal cavity showed no other  evidence of significant obstructing polyp disease. Attention was turned to  placement of sinus dressings. These consisted of saline-moistened Gelfilm,  which was folded and placed to the middle meatus bilaterally. A mixture of  triamcinolone and gentamicin cream was applied topically to the areas of  sinus surgery. With application of a sterile nasal dressing, the patient's  procedure was concluded. The patient was aroused from general anesthesia,  extubated in the operating room, and transferred to the recovery area in  satisfactory condition.          Derry Skill, MD    cc:   Derry Skill, MD        DSC/wmx; D: 01/17/2014 10:16 A; T: 01/17/2014 10:43 A; Doc# [id number]; Job#  [id number]

## 2018-05-24 ENCOUNTER — Encounter
Admission: RE | Admit: 2018-05-24 | Discharge: 2018-05-24 | Disposition: A | Discharge status: Home / Self Care | Payer: Medicare Other | Source: Ambulatory Visit | Attending: Obstetrics and Gynecology | Admitting: Obstetrics and Gynecology

## 2018-05-24 ENCOUNTER — Other Ambulatory Visit: Payer: Self-pay

## 2018-05-24 DIAGNOSIS — Z79899 Other long term (current) drug therapy: Secondary | ICD-10-CM | POA: Diagnosis not present

## 2018-05-24 DIAGNOSIS — Z01812 Encounter for preprocedural laboratory examination: Secondary | ICD-10-CM | POA: Diagnosis present

## 2018-05-24 HISTORY — DX: Headache, unspecified: R51.9

## 2018-05-24 HISTORY — DX: Family history of other specified conditions: Z84.89

## 2018-05-24 HISTORY — DX: Headache: R51

## 2018-05-24 HISTORY — DX: Gastro-esophageal reflux disease without esophagitis: K21.9

## 2018-05-24 HISTORY — DX: Pneumonia, unspecified organism: J18.9

## 2018-05-24 HISTORY — DX: Other specified postprocedural states: Z98.890

## 2018-05-24 HISTORY — DX: Nausea with vomiting, unspecified: R11.2

## 2018-05-24 HISTORY — DX: Unspecified asthma, uncomplicated: J45.909

## 2018-05-24 HISTORY — DX: Malignant (primary) neoplasm, unspecified: C80.1

## 2018-05-24 LAB — BASIC METABOLIC PANEL
Anion gap: 6 (ref 5–15)
BUN: 7 mg/dL (ref 6–20)
CALCIUM: 8.9 mg/dL (ref 8.9–10.3)
CO2: 25 mmol/L (ref 22–32)
Chloride: 106 mmol/L (ref 98–111)
Creatinine, Ser: 0.83 mg/dL (ref 0.44–1.00)
GFR calc Af Amer: >60 mL/min (ref >60)
GLUCOSE: 85 mg/dL (ref 70–99)
Potassium: 3.8 mmol/L (ref 3.5–5.1)
Sodium: 137 mmol/L (ref 135–145)

## 2018-05-24 LAB — CBC
HEMATOCRIT: 43 % (ref 35.0–47.0)
Hemoglobin: 14.9 g/dL (ref 12.0–16.0)
MCH: 32.3 pg (ref 26.0–34.0)
MCHC: 34.6 g/dL (ref 32.0–36.0)
MCV: 93.3 fL (ref 80.0–100.0)
Platelets: 332 10*3/uL (ref 150–440)
RBC: 4.61 MIL/uL (ref 3.80–5.20)
RDW: 13.6 % (ref 11.5–14.5)
WBC: 9.6 10*3/uL (ref 3.6–11.0)

## 2018-05-24 LAB — TYPE AND SCREEN
ABO/RH(D): B POS
ANTIBODY SCREEN: NEGATIVE

## 2018-05-24 NOTE — Patient Instructions (Signed)
  Your procedure is scheduled on: Monday May 30, 2018 Report to Same Day Surgery 2nd floor medical mall [facility] Entrance-take elevator on left to 2nd floor.  Check in with surgery information desk.) To find out your arrival time please call [phone] between 1PM - 3PM on Friday May 27, 2018  Remember: Instructions that are not followed completely may result in serious medical risk, up to and including death, or upon the discretion of your surgeon and anesthesiologist your surgery may need to be rescheduled.    _x___ 1. Do not eat food (including mints, candies, chewing gum) after midnight the night before your procedure. You may drink clear liquids up to 2 hours before you are scheduled to arrive at the hospital for your procedure.  Do not drink clear liquids within 2 hours of your scheduled arrival to the hospital.  Clear liquids include  --Water or Apple juice without pulp  --Clear carbohydrate beverage such as Gatorade  --Black Coffee or Clear Tea (No milk, no creamers, do not add anything to the coffee or tea)    __x__ 2. No Alcohol for 24 hours before or after surgery.   __x__ 3. No Smoking or e-cigarettes for 24 prior to surgery.  Do not use any chewable tobacco products for at least 6 hour prior to surgery   __x__ 4. Notify your doctor if there is any change in your medical condition (cold, fever, infections).   __x__ 5. On the morning of surgery brush your teeth with toothpaste and water.  You may rinse your mouth with mouth wash if you wish.  Do not swallow any toothpaste or mouthwash.  Please read over the following fact sheets that you were given:   [facility] Preparing for Surgery and or MRSA Information    __x__ Use CHG Soap or sage wipes as directed on instruction sheet    Do not wear jewelry, make-up, hairpins, clips or nail polish.  Do not wear lotions, powders, deodorant, or perfumes.   Do not shave below the face/neck 48 hours prior to surgery.   Do  not bring valuables to the hospital.    [facility] is not responsible for any belongings or valuables.               Contacts, dentures or bridgework may not be worn into surgery.  Leave your suitcase in the car. After surgery it may be brought to your room.  For patients admitted to the hospital, discharge time is determined by your treatment team.  For patients discharged on the day of surgery, you will NOT be permitted to drive yourself home.   _x___ Take anti-hypertensive listed below, cardiac, seizure, asthma, anti-reflux and psychiatric medicines. These include:  1. Baclofen (Lioresal)  2. Methazolamide (Neptazane)  3. Ranitidine (Zantac)- Take your normal dose at bedtime and additional dose the morning of surgery  4. Lorazepam (Ativan) and Promethazine (Phenergan) if needed  _x___ Use inhalers on the day of surgery and bring to hospital day of surgery  _x___ Follow recommendations from Cardiologist, Pulmonologist or PCP regarding stopping Aspirin, Coumadin, Plavix ,Eliquis, Effient, or Pradaxa, and Pletal.  _x___ Stop Anti-inflammatories such as Advil, Aleve, Ibuprofen, Motrin, Naproxen, Naprosyn, Goodies powders or aspirin products. OK to take Tylenol and Celebrex.   _x___ Stop supplements until after surgery.  But may continue Vitamin D, Vitamin B, and multivitamin.

## 2018-05-24 NOTE — H&P (Signed)
Lindsey Marsh is a 47 y.o. female . Preop visit to discuss D&C, hysteroscopy, myomectomy and with Novasure endometrial ablation and laparoscopic bilateral tubal sterilization .  She has a hx of: daily spotting with submucosal fibroid vs polyp and desire for sterilization  Workup has included: SIS, ultrasound   ENDOMETRIUM, BIOPSY:  BENIGN PROLIFERATIVE ENDOMETRIAL FRAGMENTS WITH FEATURES SUGGESTIVE OF  POLYP. FOCAL STROMAL AND/OR GLANDULAR BREAKDOWN. NO HYPERPLASIA OR  CARCINOMA.  She has a hx of autoimmune disorder with chronic pain, and chronic pelvic pain, migraines and postoperative nausea/vomiting.  Past Medical History:  has a past medical history of Abnormal thyroid ultrasound @ [facility] 08/04/2012 (however, unchanged from prior study) (08/04/2012), Anterolisthesis (10/12/2011), Anxiety, ASTHMA, Basal cell carcinoma, Cervical spondylosis (10/12/2011), Chronic pain syndrome (03/27/2014), CSF leak (2012), EMG normal of the upper extremities (08/07/2009), Encounter for therapeutic drug monitoring (07/28/2012), Fibroid, Hair loss (12/26/2012), History of blood transfusion, History of melanoma (08/06/2017), Idiopathic intracranial hypertension, Intractable chronic migraine without aura and without status migrainosus (03/27/2014), Issue of repeat prescription for medication (07/28/2012), Issue of repeat prescription for medication (Ritalin) for hypersomnolence (04/18/2012), Lung nodules, Migraines, Night sweats (11/17/2011), Occipital neuralgia (10/12/2011), Osteopenia, PONV (postoperative nausea and vomiting), unspecified, Post-dural puncture headache treated with epidural blood patch @ [place] (12/22/2010), Prescription drug started (05/26/2013), Pulmonary nodules (10/12/2011), Raynaud phenomenon (11/22/2012), Sinus tachycardia, Sore throat, unspecified, Thyroid disease, Trigger point with neck pain (03/04/2012), and Voltage-gated potassium channel antibody syndrome.  Past Surgical History:  has a past surgical  history that includes Myomectomy; LEFT BREAST LUMP REMOVAL; Lumbar puncture (2010 and 2012); Blood patch (2012); Excision basal cell carcinoma (2012 and 2013); Skin biopsy (2012 and 2013); injection anesthetic agent greater occipital nerve (Bilateral, 03/04/2012); injection trigger point muscle (Bilateral, 03/04/12); injection anesthetic agent greater occipital nerve (06/07/2012); Inj; Single/Mx Trig Point 3/More Muscle Grp [phone]) (06/07/2012); Trigger point injection (10/27/2012); esophagogastrodoudenoscopy w/biopsy (N/A, 04/11/2014); colonoscopy (N/A, 12/26/2014); Radiofrequency ablation nerves; and melanoma removal. Family History: family history includes Coronary Artery Disease (Blocked arteries around heart) in her maternal grandmother and paternal grandmother; Diabetes in her maternal grandmother and paternal grandmother; Heart disease in her maternal grandfather and maternal grandmother; High blood pressure (Hypertension) in her sister; Hyperlipidemia (Elevated cholesterol) in her mother; Migraines in her sister; Osteoarthritis in her mother; Osteoporosis (Thinning of bones) in her mother; Stroke in her maternal grandmother. Social History:  reports that she has never smoked. She has never used smokeless tobacco. She reports that she does not drink alcohol or use drugs. OB/GYN History:          OB History    Gravida  0   Para      Term      Preterm      AB      Living        SAB      TAB      Ectopic      Molar      Multiple      Live Births             Allergies: is allergic to foradil aerolizer [formoterol fumarate]. Medications:  Current Outpatient Medications:  .  acyclovir (ZOVIRAX) 400 MG tablet, Take 1 tablet (400 mg total) by mouth 5 (five) times daily, Disp: 25 tablet, Rfl: 0 .  baclofen (LIORESAL) 10 MG tablet, Take 1 tablet (10 mg total) by mouth 2 (two) times daily, Disp: 180 tablet, Rfl: 0 .  budesonide-formoterol (SYMBICORT) 160-4.5 mcg/actuation  inhaler, Inhale 2 inhalations into the lungs 2 (two) times  daily, Disp: 12 g, Rfl: 3 .  fluticasone propionate (FLONASE) 50 mcg/actuation nasal spray, SHAKE LQ AND U 2 SPRAYS IEN QD, Disp: , Rfl: 12 .  ipratropium (ATROVENT) 0.06 % nasal spray, Place 2 sprays into both nostrils 3 (three) times daily as needed for Rhinitis, Disp: 105 mL, Rfl: prn .  levocetirizine (XYZAL) 5 MG tablet, Take 1 tablet (5 mg total) by mouth every evening, Disp: 30 tablet, Rfl: 12 .  lidocaine (LIDODERM) 5 % patch, Place 1 patch onto the skin daily Apply patch to the most painful area for up to 12 hours in a 24 hour period., Disp: 90 patch, Rfl: 3 .  LORazepam (ATIVAN) 1 MG tablet, Take 1 tablet (1 mg total) by mouth every 8 (eight) hours as needed, Disp: 35 tablet, Rfl: 0 .  methazolAMIDE (NEPTAZANE) 25 MG tablet, TAKE 1 TABLET(25 MG) BY MOUTH TWICE DAILY, Disp: 180 tablet, Rfl: 0 .  methylphenidate HCl (RITALIN) 20 MG tablet, Take 1 tablet (20 mg total) by mouth 2 (two) times daily, Disp: 60 tablet, Rfl: 0 .  miscellaneous medical supply (BLOOD PRESSURE CUFF) Misc, Use 1 each once daily, Disp: 1 each, Rfl: 0 .  montelukast (SINGULAIR) 10 mg tablet, Take 1 tablet (10 mg total) by mouth nightly, Disp: 90 tablet, Rfl: 3 .  NARCAN 4 mg/actuation, , Disp: , Rfl: 0 .  promethazine (PHENERGAN) 12.5 MG tablet, Take 1 tablet (12.5 mg total) by mouth every 6 (six) hours as needed for Nausea, Disp: 30 tablet, Rfl: 0 .  ranitidine (ZANTAC) 150 MG tablet, Take 1 tablet (150 mg total) by mouth nightly, Disp: 90 tablet, Rfl: 3 .  rizatriptan (MAXALT) 10 MG tablet, May take a second dose after 2 hours if needed., Disp: 24 tablet, Rfl: 0 .  tapentadol (NUCYNTA) 50 mg tablet, Take 1 tablet (50 mg total) by mouth 3 (three) times daily, Disp: 90 tablet, Rfl: 0 .  traMADol (ULTRAM-ER) 300 MG ER tablet, Take 1 tablet (300 mg total) by mouth once daily, Disp: 90 tablet, Rfl: 0 .  VENTOLIN HFA 90 mcg/actuation inhaler, INHALE 2 INHALATIONS INTO   THE LUNGS EVERY 6 HOURS AS  NEEDED FOR WHEEZING., Disp: 54 g, Rfl: 0 .  zolpidem (AMBIEN) 10 mg tablet, TAKE 1 TABLET(10 MG) BY MOUTH EVERY NIGHT, Disp: 90 tablet, Rfl: 0  Review of Systems: No SOB, no palpitations or chest pain, no new lower extremity edema, no nausea or vomiting or bowel or bladder complaints. See HPI for gyn specific ROS.   Exam:   There were no vitals taken for this visit.  General: Patient is well-groomed, well-nourished, appears stated age in no acute distress  HEENT: head is atraumatic and normocephalic, trachea is midline, neck is supple with no palpable nodules  CV: Regular rhythm and normal heart rate, no murmur  Pulm: Clear to auscultation throughout lung fields with no wheezing, crackles, or rhonchi. No increased work of breathing  Abdomen: soft , no mass, non-tender, no rebound tenderness, no hepatomegaly  Pelvic:  Deferred    Impression:   The encounter diagnosis was Preop examination.    Plan:      Preoperative visit: D&C hysteroscopy, Myosure polypectomy, laparoscopic BTL and Novasure ablation. Risks of surgery were discussed with the patient including but not limited to: bleeding which may require transfusion; infection which may require antibiotics; injury to uterus or surrounding organs; intrauterine scarring which may impair future fertility; need for additional procedures including laparotomy or laparoscopy; and other postoperative/anesthesia complications. Written informed  consent was obtained.  Patient desires surgical sterilization.  Patient has been counseled on alternate forms of contraception including hormonal forms, IUD's and barrier methods. She has been counseled on risks of surgical sterilization including bleeding, infection, pain, injury during procedure, risk of need for further procedures/surgeries due to injury or abnormalities at the time of surgery, thromboembolic events, exacerbation of ongoing medical  conditions, risk of ectopic pregnancy, risk of failure of procedure to prevent pregnancy, medication reactions as well as the risk of anesthesia.  Patient verbalizes understanding. Preoperative and postoperative instructions provided. Written and verbal education provided.  No barriers to learning.  This is a scheduled same-day surgery. She will have a postop visit to review operative findings and pathology.  Return in about 2 weeks (around 06/07/2018) for Postop check.

## 2018-05-30 ENCOUNTER — Other Ambulatory Visit: Payer: Self-pay

## 2018-05-30 ENCOUNTER — Encounter
Admission: RE | Disposition: A | Discharge status: Home / Self Care | Payer: Self-pay | Source: Ambulatory Visit | Attending: Obstetrics and Gynecology

## 2018-05-30 ENCOUNTER — Ambulatory Visit: Payer: Medicare Other | Admitting: Anesthesiology

## 2018-05-30 ENCOUNTER — Encounter: Payer: Self-pay | Admitting: *Deleted

## 2018-05-30 ENCOUNTER — Observation Stay
Admission: RE | Admit: 2018-05-30 | Discharge: 2018-05-31 | Disposition: A | Discharge status: Home / Self Care | Payer: Medicare Other | Source: Ambulatory Visit | Attending: Obstetrics and Gynecology | Admitting: Obstetrics and Gynecology

## 2018-05-30 DIAGNOSIS — R102 Pelvic and perineal pain: Secondary | ICD-10-CM | POA: Insufficient documentation

## 2018-05-30 DIAGNOSIS — I7300 Raynaud's syndrome without gangrene: Secondary | ICD-10-CM | POA: Insufficient documentation

## 2018-05-30 DIAGNOSIS — D259 Leiomyoma of uterus, unspecified: Secondary | ICD-10-CM | POA: Diagnosis not present

## 2018-05-30 DIAGNOSIS — G43909 Migraine, unspecified, not intractable, without status migrainosus: Secondary | ICD-10-CM | POA: Diagnosis not present

## 2018-05-30 DIAGNOSIS — Z79899 Other long term (current) drug therapy: Secondary | ICD-10-CM | POA: Diagnosis not present

## 2018-05-30 DIAGNOSIS — J45909 Unspecified asthma, uncomplicated: Secondary | ICD-10-CM | POA: Diagnosis not present

## 2018-05-30 DIAGNOSIS — N838 Other noninflammatory disorders of ovary, fallopian tube and broad ligament: Secondary | ICD-10-CM | POA: Diagnosis not present

## 2018-05-30 DIAGNOSIS — N920 Excessive and frequent menstruation with regular cycle: Secondary | ICD-10-CM | POA: Diagnosis present

## 2018-05-30 DIAGNOSIS — Z79891 Long term (current) use of opiate analgesic: Secondary | ICD-10-CM | POA: Diagnosis not present

## 2018-05-30 DIAGNOSIS — Z7951 Long term (current) use of inhaled steroids: Secondary | ICD-10-CM | POA: Insufficient documentation

## 2018-05-30 DIAGNOSIS — Z85820 Personal history of malignant melanoma of skin: Secondary | ICD-10-CM | POA: Insufficient documentation

## 2018-05-30 DIAGNOSIS — Z302 Encounter for sterilization: Principal | ICD-10-CM | POA: Insufficient documentation

## 2018-05-30 DIAGNOSIS — N840 Polyp of corpus uteri: Secondary | ICD-10-CM | POA: Diagnosis not present

## 2018-05-30 DIAGNOSIS — G894 Chronic pain syndrome: Secondary | ICD-10-CM | POA: Insufficient documentation

## 2018-05-30 DIAGNOSIS — F419 Anxiety disorder, unspecified: Secondary | ICD-10-CM | POA: Insufficient documentation

## 2018-05-30 HISTORY — PX: LAPAROSCOPY: SHX197

## 2018-05-30 HISTORY — DX: Anxiety disorder, unspecified: F41.9

## 2018-05-30 HISTORY — PX: LAPAROSCOPIC TUBAL LIGATION: SHX1937

## 2018-05-30 HISTORY — DX: Other specified congenital malformation syndromes, not elsewhere classified: Q87.89

## 2018-05-30 HISTORY — PX: HYSTEROSCOPY WITH NOVASURE: SHX5574

## 2018-05-30 LAB — ABO/RH: ABO/RH(D): B POS

## 2018-05-30 LAB — POCT PREGNANCY, URINE: Preg Test, Ur: NEGATIVE

## 2018-05-30 SURGERY — HYSTEROSCOPY WITH NOVASURE
Anesthesia: General

## 2018-05-30 MED ORDER — LIDOCAINE HCL (PF) 2 % IJ SOLN
INTRAMUSCULAR | Status: AC
  Filled 2018-05-30: qty 10

## 2018-05-30 MED ORDER — MIDAZOLAM HCL 2 MG/2ML IJ SOLN
INTRAMUSCULAR | Status: DC | PRN
  Administered 2018-05-30: 2 mg via INTRAVENOUS

## 2018-05-30 MED ORDER — PROPOFOL 500 MG/50ML IV EMUL
INTRAVENOUS | Status: DC | PRN
  Administered 2018-05-30: 150 ug/kg/min via INTRAVENOUS

## 2018-05-30 MED ORDER — OXYCODONE HCL 5 MG PO TABS
5.0000 mg | ORAL_TABLET | Freq: Once | ORAL | Status: AC | PRN
  Administered 2018-05-30: 5 mg via ORAL

## 2018-05-30 MED ORDER — FENTANYL CITRATE (PF) 100 MCG/2ML IJ SOLN
INTRAMUSCULAR | Status: AC
  Administered 2018-05-30: 50 ug via INTRAVENOUS
  Filled 2018-05-30: qty 2

## 2018-05-30 MED ORDER — PROPOFOL 10 MG/ML IV BOLUS
INTRAVENOUS | Status: DC | PRN
  Administered 2018-05-30: 150 mg via INTRAVENOUS

## 2018-05-30 MED ORDER — CELECOXIB 200 MG PO CAPS
200.0000 mg | ORAL_CAPSULE | Freq: Once | ORAL | Status: AC
  Administered 2018-05-30: 200 mg via ORAL

## 2018-05-30 MED ORDER — PHENYLEPHRINE HCL 10 MG/ML IJ SOLN
INTRAMUSCULAR | Status: AC
  Filled 2018-05-30: qty 1

## 2018-05-30 MED ORDER — MENTHOL 3 MG MT LOZG
1.0000 | LOZENGE | OROMUCOSAL | Status: DC | PRN
  Filled 2018-05-30: qty 9

## 2018-05-30 MED ORDER — ROCURONIUM BROMIDE 100 MG/10ML IV SOLN
INTRAVENOUS | Status: DC | PRN
  Administered 2018-05-30: 10 mg via INTRAVENOUS
  Administered 2018-05-30: 50 mg via INTRAVENOUS

## 2018-05-30 MED ORDER — OXYCODONE HCL 5 MG/5ML PO SOLN: 5.0000 mg | Freq: Once | ORAL | Status: AC | PRN

## 2018-05-30 MED ORDER — SIMETHICONE 80 MG PO CHEW: 80.0000 mg | CHEWABLE_TABLET | Freq: Four times a day (QID) | ORAL | Status: DC | PRN

## 2018-05-30 MED ORDER — BUPIVACAINE HCL 0.5 % IJ SOLN
INTRAMUSCULAR | Status: DC | PRN
  Administered 2018-05-30: 7 mL

## 2018-05-30 MED ORDER — FENTANYL CITRATE (PF) 100 MCG/2ML IJ SOLN
INTRAMUSCULAR | Status: DC | PRN
  Administered 2018-05-30: 25 ug via INTRAVENOUS
  Administered 2018-05-30: 50 ug via INTRAVENOUS
  Administered 2018-05-30: 25 ug via INTRAVENOUS

## 2018-05-30 MED ORDER — EPHEDRINE SULFATE 50 MG/ML IJ SOLN
INTRAMUSCULAR | Status: AC
  Filled 2018-05-30: qty 1

## 2018-05-30 MED ORDER — SUCCINYLCHOLINE CHLORIDE 20 MG/ML IJ SOLN
INTRAMUSCULAR | Status: AC
  Filled 2018-05-30: qty 1

## 2018-05-30 MED ORDER — SUGAMMADEX SODIUM 200 MG/2ML IV SOLN
INTRAVENOUS | Status: AC
  Filled 2018-05-30: qty 2

## 2018-05-30 MED ORDER — OXYCODONE HCL 5 MG PO TABS
ORAL_TABLET | ORAL | Status: AC
  Filled 2018-05-30: qty 1

## 2018-05-30 MED ORDER — ONDANSETRON HCL 4 MG/2ML IJ SOLN
INTRAMUSCULAR | Status: AC
  Filled 2018-05-30: qty 2

## 2018-05-30 MED ORDER — ONDANSETRON HCL 4 MG/2ML IJ SOLN
4.0000 mg | Freq: Four times a day (QID) | INTRAMUSCULAR | Status: DC | PRN
  Administered 2018-05-30: 4 mg via INTRAVENOUS
  Filled 2018-05-30: qty 2

## 2018-05-30 MED ORDER — ACETAMINOPHEN 10 MG/ML IV SOLN
INTRAVENOUS | Status: DC | PRN
  Administered 2018-05-30: 1000 mg via INTRAVENOUS

## 2018-05-30 MED ORDER — ZOLPIDEM TARTRATE 5 MG PO TABS: 5.0000 mg | ORAL_TABLET | Freq: Every evening | ORAL | Status: DC | PRN

## 2018-05-30 MED ORDER — LACTATED RINGERS IV SOLN
INTRAVENOUS | Status: DC
  Administered 2018-05-30: 125 mL/h via INTRAVENOUS

## 2018-05-30 MED ORDER — IBUPROFEN 800 MG PO TABS: 800.0000 mg | ORAL_TABLET | Freq: Three times a day (TID) | ORAL | 1 refills | Status: AC | PRN

## 2018-05-30 MED ORDER — GABAPENTIN 300 MG PO CAPS
ORAL_CAPSULE | ORAL | Status: AC
  Administered 2018-05-30: 300 mg via ORAL
  Filled 2018-05-30: qty 1

## 2018-05-30 MED ORDER — KETOROLAC TROMETHAMINE 30 MG/ML IJ SOLN
30.0000 mg | Freq: Four times a day (QID) | INTRAMUSCULAR | Status: AC
  Administered 2018-05-30 – 2018-05-31 (×3): 30 mg via INTRAVENOUS
  Filled 2018-05-30 (×2): qty 1

## 2018-05-30 MED ORDER — ROCURONIUM BROMIDE 50 MG/5ML IV SOLN
INTRAVENOUS | Status: AC
  Filled 2018-05-30: qty 1

## 2018-05-30 MED ORDER — CELECOXIB 200 MG PO CAPS
ORAL_CAPSULE | ORAL | Status: AC
  Administered 2018-05-30: 200 mg via ORAL
  Filled 2018-05-30: qty 1

## 2018-05-30 MED ORDER — DEXAMETHASONE SODIUM PHOSPHATE 10 MG/ML IJ SOLN
INTRAMUSCULAR | Status: AC
  Filled 2018-05-30: qty 1

## 2018-05-30 MED ORDER — DEXAMETHASONE SODIUM PHOSPHATE 10 MG/ML IJ SOLN
INTRAMUSCULAR | Status: DC | PRN
  Administered 2018-05-30: 10 mg via INTRAVENOUS

## 2018-05-30 MED ORDER — BUPIVACAINE HCL (PF) 0.5 % IJ SOLN
INTRAMUSCULAR | Status: AC
  Filled 2018-05-30: qty 30

## 2018-05-30 MED ORDER — FENTANYL CITRATE (PF) 100 MCG/2ML IJ SOLN
25.0000 ug | INTRAMUSCULAR | Status: DC | PRN
  Administered 2018-05-30 (×3): 50 ug via INTRAVENOUS

## 2018-05-30 MED ORDER — KETOROLAC TROMETHAMINE 30 MG/ML IJ SOLN
INTRAMUSCULAR | Status: AC
  Administered 2018-05-30: 30 mg via INTRAVENOUS
  Filled 2018-05-30: qty 1

## 2018-05-30 MED ORDER — PROPOFOL 10 MG/ML IV BOLUS
INTRAVENOUS | Status: AC
  Filled 2018-05-30: qty 20

## 2018-05-30 MED ORDER — ONDANSETRON HCL 4 MG/2ML IJ SOLN
INTRAMUSCULAR | Status: DC | PRN
  Administered 2018-05-30: 4 mg via INTRAVENOUS

## 2018-05-30 MED ORDER — ALUM & MAG HYDROXIDE-SIMETH 200-200-20 MG/5ML PO SUSP: 30.0000 mL | ORAL | Status: DC | PRN

## 2018-05-30 MED ORDER — SUGAMMADEX SODIUM 200 MG/2ML IV SOLN
INTRAVENOUS | Status: DC | PRN
  Administered 2018-05-30: 150 mg via INTRAVENOUS

## 2018-05-30 MED ORDER — MEPERIDINE HCL 50 MG/ML IJ SOLN: 6.2500 mg | INTRAMUSCULAR | Status: DC | PRN

## 2018-05-30 MED ORDER — MONTELUKAST SODIUM 10 MG PO TABS
10.0000 mg | ORAL_TABLET | Freq: Every day | ORAL | Status: DC
  Administered 2018-05-30: 10 mg via ORAL
  Filled 2018-05-30 (×2): qty 1

## 2018-05-30 MED ORDER — DOCUSATE SODIUM 100 MG PO CAPS
100.0000 mg | ORAL_CAPSULE | Freq: Two times a day (BID) | ORAL | Status: DC
  Administered 2018-05-30 – 2018-05-31 (×2): 100 mg via ORAL
  Filled 2018-05-30 (×2): qty 1

## 2018-05-30 MED ORDER — DOCUSATE SODIUM 100 MG PO CAPS: 100.0000 mg | ORAL_CAPSULE | Freq: Two times a day (BID) | ORAL | 0 refills | Status: AC

## 2018-05-30 MED ORDER — LIDOCAINE HCL (CARDIAC) PF 100 MG/5ML IV SOSY
PREFILLED_SYRINGE | INTRAVENOUS | Status: DC | PRN
  Administered 2018-05-30: 80 mg via INTRAVENOUS

## 2018-05-30 MED ORDER — CETIRIZINE HCL 10 MG PO TABS
10.0000 mg | ORAL_TABLET | Freq: Every day | ORAL | Status: DC
  Administered 2018-05-30: 10 mg via ORAL
  Filled 2018-05-30 (×2): qty 1

## 2018-05-30 MED ORDER — ACETAMINOPHEN 10 MG/ML IV SOLN
INTRAVENOUS | Status: AC
  Filled 2018-05-30: qty 100

## 2018-05-30 MED ORDER — PROPOFOL 500 MG/50ML IV EMUL
INTRAVENOUS | Status: AC
  Filled 2018-05-30: qty 100

## 2018-05-30 MED ORDER — GABAPENTIN 800 MG PO TABS: 800.0000 mg | ORAL_TABLET | Freq: Every day | ORAL | 0 refills | Status: AC

## 2018-05-30 MED ORDER — LACTATED RINGERS IV SOLN
INTRAVENOUS | Status: DC
  Administered 2018-05-30 – 2018-05-31 (×3): via INTRAVENOUS

## 2018-05-30 MED ORDER — OXYCODONE HCL 5 MG PO CAPS: 5.0000 mg | ORAL_CAPSULE | Freq: Four times a day (QID) | ORAL | 0 refills | Status: AC | PRN

## 2018-05-30 MED ORDER — NON FORMULARY: 5.0000 mg | Freq: Every day | Status: DC

## 2018-05-30 MED ORDER — KETOROLAC TROMETHAMINE 30 MG/ML IJ SOLN
30.0000 mg | Freq: Once | INTRAMUSCULAR | Status: AC
  Administered 2018-05-30: 30 mg via INTRAVENOUS

## 2018-05-30 MED ORDER — GABAPENTIN 300 MG PO CAPS
300.0000 mg | ORAL_CAPSULE | Freq: Once | ORAL | Status: AC
  Administered 2018-05-30: 300 mg via ORAL

## 2018-05-30 MED ORDER — HYDROMORPHONE HCL 1 MG/ML IJ SOLN
0.2000 mg | INTRAMUSCULAR | Status: DC | PRN
  Administered 2018-05-30 (×2): 0.6 mg via INTRAVENOUS
  Administered 2018-05-30: 0.4 mg via INTRAVENOUS
  Administered 2018-05-30 – 2018-05-31 (×3): 0.6 mg via INTRAVENOUS
  Filled 2018-05-30 (×6): qty 1

## 2018-05-30 MED ORDER — MIDAZOLAM HCL 2 MG/2ML IJ SOLN
INTRAMUSCULAR | Status: AC
  Filled 2018-05-30: qty 2

## 2018-05-30 MED ORDER — KETOROLAC TROMETHAMINE 30 MG/ML IJ SOLN
30.0000 mg | Freq: Four times a day (QID) | INTRAMUSCULAR | Status: AC
  Filled 2018-05-30: qty 1

## 2018-05-30 MED ORDER — ONDANSETRON HCL 4 MG PO TABS: 4.0000 mg | ORAL_TABLET | Freq: Four times a day (QID) | ORAL | Status: DC | PRN

## 2018-05-30 MED ORDER — PROMETHAZINE HCL 25 MG/ML IJ SOLN: 6.2500 mg | INTRAMUSCULAR | Status: DC | PRN

## 2018-05-30 MED ORDER — BACLOFEN 10 MG PO TABS
10.0000 mg | ORAL_TABLET | Freq: Two times a day (BID) | ORAL | Status: DC
  Administered 2018-05-30 – 2018-05-31 (×2): 10 mg via ORAL
  Filled 2018-05-30 (×3): qty 1

## 2018-05-30 MED ORDER — FENTANYL CITRATE (PF) 100 MCG/2ML IJ SOLN
INTRAMUSCULAR | Status: AC
  Filled 2018-05-30: qty 2

## 2018-05-30 MED ORDER — ACETAMINOPHEN 500 MG PO TABS: 1000.0000 mg | ORAL_TABLET | Freq: Four times a day (QID) | ORAL | 0 refills | Status: AC

## 2018-05-30 MED ORDER — GLYCOPYRROLATE 0.2 MG/ML IJ SOLN
INTRAMUSCULAR | Status: AC
  Filled 2018-05-30: qty 1

## 2018-05-30 SURGICAL SUPPLY — 51 items
ABLATOR SURESOUND NOVASURE (ABLATOR) ×4 IMPLANT
BAG URINE DRAINAGE (UROLOGICAL SUPPLIES) ×4 IMPLANT
BLADE SURG SZ11 CARB STEEL (BLADE) ×4 IMPLANT
CANISTER SUCT 1200ML W/VALVE (MISCELLANEOUS) ×4 IMPLANT
CATH FOLEY 2WAY  5CC 16FR (CATHETERS) ×2
CATH ROBINSON RED A/P 16FR (CATHETERS) ×4 IMPLANT
CATH URTH 16FR FL 2W BLN LF (CATHETERS) ×2 IMPLANT
CHLORAPREP W/TINT 26ML (MISCELLANEOUS) ×4 IMPLANT
CLOSURE WOUND 1/4X4 (GAUZE/BANDAGES/DRESSINGS) ×1
DERMABOND ADVANCED (GAUZE/BANDAGES/DRESSINGS) ×2
DERMABOND ADVANCED .7 DNX12 (GAUZE/BANDAGES/DRESSINGS) ×2 IMPLANT
DRAPE UTILITY 15X26 TOWEL STRL (DRAPES) ×8 IMPLANT
GLOVE BIO SURGEON STRL SZ7 (GLOVE) ×12 IMPLANT
GLOVE INDICATOR 7.5 STRL GRN (GLOVE) ×12 IMPLANT
GOWN STRL REUS W/ TWL LRG LVL3 (GOWN DISPOSABLE) ×4 IMPLANT
GOWN STRL REUS W/TWL LRG LVL3 (GOWN DISPOSABLE) ×4
GRASPER SUT TROCAR 14GX15 (MISCELLANEOUS) ×4 IMPLANT
IRRIGATION STRYKERFLOW (MISCELLANEOUS) IMPLANT
IRRIGATOR STRYKERFLOW (MISCELLANEOUS)
IV LACTATED RINGERS 1000ML (IV SOLUTION) ×4 IMPLANT
IV NS 1000ML (IV SOLUTION) ×2
IV NS 1000ML BAXH (IV SOLUTION) ×2 IMPLANT
KIT PINK PAD W/HEAD ARE REST (MISCELLANEOUS) ×4
KIT PINK PAD W/HEAD ARM REST (MISCELLANEOUS) ×2 IMPLANT
KIT PROCEDURE FLUENT (KITS) ×4 IMPLANT
KIT TURNOVER CYSTO (KITS) ×4 IMPLANT
LABEL OR SOLS (LABEL) ×4 IMPLANT
LIGASURE VESSEL 5MM BLUNT TIP (ELECTROSURGICAL) ×4 IMPLANT
NS IRRIG 500ML POUR BTL (IV SOLUTION) ×4 IMPLANT
PACK DNC HYST (MISCELLANEOUS) ×4 IMPLANT
PACK GYN LAPAROSCOPIC (MISCELLANEOUS) ×4 IMPLANT
PAD OB MATERNITY 4.3X12.25 (PERSONAL CARE ITEMS) ×4 IMPLANT
PAD PREP 24X41 OB/GYN DISP (PERSONAL CARE ITEMS) ×4 IMPLANT
POUCH SPECIMEN RETRIEVAL 10MM (ENDOMECHANICALS) IMPLANT
SCISSORS METZENBAUM CVD 33 (INSTRUMENTS) IMPLANT
SLEEVE ENDOPATH XCEL 5M (ENDOMECHANICALS) ×4 IMPLANT
STRIP CLOSURE SKIN 1/4X4 (GAUZE/BANDAGES/DRESSINGS) ×3 IMPLANT
SUT MNCRL 4-0 (SUTURE) ×2
SUT MNCRL 4-0 27XMFL (SUTURE) ×2
SUT MNCRL AB 4-0 PS2 18 (SUTURE) IMPLANT
SUT VIC AB 0 UR5 27 (SUTURE) ×4 IMPLANT
SUT VIC AB 2-0 UR6 27 (SUTURE) ×4 IMPLANT
SUT VIC AB 4-0 SH 27 (SUTURE)
SUT VIC AB 4-0 SH 27XANBCTRL (SUTURE) IMPLANT
SUTURE MNCRL 4-0 27XMF (SUTURE) ×2 IMPLANT
TOWEL OR 17X26 4PK STRL BLUE (TOWEL DISPOSABLE) ×4 IMPLANT
TROCAR ENDO BLADELESS 11MM (ENDOMECHANICALS) IMPLANT
TROCAR XCEL NON-BLD 5MMX100MML (ENDOMECHANICALS) ×4 IMPLANT
TUBING CONNECTING 10 (TUBING) ×3 IMPLANT
TUBING CONNECTING 10' (TUBING) ×1
TUBING INSUFFLATION (TUBING) ×4 IMPLANT

## 2018-05-30 NOTE — Op Note (Signed)
Operative Report Laparoscopic bilateral tubal ligation Hysteroscopy with Dilation and Curettage; Novasure ablation   Indications: Menorrhagia   Pre-operative Diagnosis:  Fibroid uterus Intrauterine polyp Undesired fertility Menorrhagia  Chronic pelvic pain  Post-operative Diagnosis: same.  Procedure: 1. Exam under anesthesia 2. Laparoscopic bilateral tubal ligation 3. D&C with endocervical curettage  4. Hysteroscopy 5. Novasure endometrial ablation 6. Diagnostic laparoscopy  Surgeon: Angelina Pih, MD  Assistant(s):  None  Anesthesia: General LMA anesthesia  Anesthesiologist: No responsible provider has been recorded for the case. Anesthesiologist: Emmie Niemann, MD CRNA: Aline Brochure, CRNA; Doreen Salvage, CRNA  Estimated Blood Loss:  Minimal         Intraoperative medications: Toradol, IV acetominophen         Total IV Fluids: 64ml  Urine Output: 247ml         Specimens: Myosure curettings, endometrial curettings         Complications:  None; patient tolerated the procedure well.         Disposition: PACU - hemodynamically stable.         Condition: stable  Findings: Uterus measuring 7 cm by sound; normal cervix, vagina, perineum. No intraperitoneal adhesions were noted. Normal uterus, upper abdomen. Cervical length: 4 cm Uterine cavity length: 5 cm Uterine cavity width: 3.6 cm  Scarring between uterus and bladder flap appeared filmy but present. 3 small dark areas over the uterine vessel and cornua on the left appeared to be endometriosis, but no bx taken because of their anatomic placement. No clear endo adhesions were noted.   Indication for procedure/Consents: 47 y.o.  G0 here for scheduled surgery for the aforementioned diagnoses.   Risks of surgery were discussed with the patient including but not limited to: bleeding which may require transfusion; infection which may require antibiotics; injury to uterus or surrounding organs; intrauterine  scarring which may impair future fertility; need for additional procedures including laparotomy or laparoscopy; and other postoperative/anesthesia complications. Written informed consent was obtained.    Procedure Details:   The patient was taken to the operating room where general anesthesia was administered and was found to be adequate. After a formal and adequate timeout was performed, she was placed in the dorsal lithotomy position and examined with the above findings. She was then prepped and draped in the sterile manner. Her bladder was catheterized for an estimated amount of clear, yellow urine. A weighed speculum was then placed in the patient's vagina and a single tooth tenaculum was applied to the anterior lip of the cervix.  TECHNIQUE:  The patient was taken to the operating room where general anesthesia was obtained without difficulty.  She was then placed in the dorsal lithotomy position and prepared and draped in sterile fashion.  The bladder was cathed for an estimated amount of clear urine. After an adequate timeout was performed, a bivalved speculum was then placed in the patient's vagina, and the anterior lip of cervix grasped with the single-tooth tenaculum.  The acorn uterine manipulator was then advanced into the uterus.  The speculum was removed from the vagina.   Attention was then turned to the patient's abdomen where a 5-mm skin incision was made in the umbilical fold.  The Optiview 5-mm trocar and sleeve were then advanced without difficulty with the laparoscope under direct visualization into the abdomen.  The abdomen was then insufflated with carbon dioxide gas and adequate pneumoperitoneum was obtained.  A survey of the patient's pelvis and abdomen revealed entirely normal anatomy, though several small areas over  the left lateral vessels appear to be endometriosis. No bx were taken because of their placement.     The fallopian tubes were observed and found to be normal in  appearance. A 27mm port was placed in the left lower quadrant under direct visualization. A Ligasure device was then advanced through the operative port and used to coagulate and excise the distal portion of the Fallopian tube, including the fimbriated ends.  Good blanching and coagulation was noted at the site of the application.  There was no bleeding noted in the mesosalpinx.  A similar process was carried out on the right fallopian tube allowing for bilateral tubal sterilization.   Good hemostasis was noted overall.  Local analgesia was drizzled on both operative sites.The instruments were then removed from the patient's abdomen and the fascial incision was repaired with 0 Vicryl, and the skin was closed with Dermabond.  The uterine manipulator was removed from the vagina without complications.   Her cervix was serially dilated to 15 [place] using Hanks dilators.The hysteroscope was introduced to reveal the above findings.The hysteroscope was also used to determine the level of the internal os, and measurements were confirmed. The uterine cavity was carefully examined, both ostia were recognized, and diffusely proliferative endometrium with polypoid fragments was noted.  A sharp curettage was then performed until there was a gritty texture in all four quadrants.   NOVASURE PROCEDURE DETAILS:   The cervix was further dilated to accommodate the NovaSure device.  The NovaSure device was inserted, and the cavity width was determined. The endometrial ablation was performed. The hysteroscope was not re-introduced into the uterine cavity, to decrease the risk of pelvic infection. The tenaculum was removed from the anterior lip of the cervix, and the vaginal speculum was removed after noting good hemostasis.  She received iv acetaminophen and Toradol prior to leaving the OR. The patient tolerated the procedure well and was taken to the recovery area awake, extubated and in stable condition.  The patient will be  discharged to home as per PACU criteria.  Routine postoperative instructions given.  She was prescribed Percocet, Ibuprofen and Colace.  She will follow up in the clinic in two weeks for postoperative evaluation.

## 2018-05-30 NOTE — Progress Notes (Signed)
Pt awakened to take to room for admit, report given tp floor RN

## 2018-05-30 NOTE — Anesthesia Preprocedure Evaluation (Signed)
Anesthesia Evaluation  Patient identified by MRN, date of birth, ID band Patient awake    Reviewed: Allergy & Precautions, NPO status , Patient's Chart, lab work & pertinent test results  History of Anesthesia Complications (+) PONV and history of anesthetic complications  Airway Mallampati: II  TM Distance: >3 FB Neck ROM: Full    Dental no notable dental hx.    Pulmonary asthma ,    breath sounds clear to auscultation- rhonchi (-) wheezing      Cardiovascular Exercise Tolerance: Good (-) hypertension(-) CAD, (-) Past MI, (-) Cardiac Stents and (-) CABG  Rhythm:Regular Rate:Normal - Systolic murmurs and - Diastolic murmurs    Neuro/Psych  Headaches, Anxiety    GI/Hepatic Neg liver ROS, GERD  ,  Endo/Other  negative endocrine ROSneg diabetes  Renal/GU negative Renal ROS     Musculoskeletal negative musculoskeletal ROS (+)   Abdominal (+) - obese,   Peds  Hematology negative hematology ROS (+)   Anesthesia Other Findings Past Medical History: No date: Anxiety No date: Asthma No date: [facility])     Comment:  melanoma excised No date: Family history of adverse reaction to anesthesia     Comment:  sister and mom PONV No date: GERD (gastroesophageal reflux disease) No date: Headache     Comment:  migraiines No date: Pneumonia     Comment:  years ago No date: PONV (postoperative nausea and vomiting)     Comment:  once in 1996, but no issues with future surgeries No date: Voltage-gated potassium channel (VGKC) antibody syndrome   Reproductive/Obstetrics                             Anesthesia Physical Anesthesia Plan  ASA: II  Anesthesia Plan: General   Post-op Pain Management:    Induction: Intravenous  PONV Risk Score and Plan: 3 and Ondansetron, Dexamethasone, Midazolam, TIVA and Propofol infusion  Airway Management Planned: Oral ETT  Additional Equipment:   Intra-op  Plan:   Post-operative Plan: Extubation in OR  Informed Consent: I have reviewed the patients History and Physical, chart, labs and discussed the procedure including the risks, benefits and alternatives for the proposed anesthesia with the patient or authorized representative who has indicated his/her understanding and acceptance.   Dental advisory given  Plan Discussed with: CRNA and Anesthesiologist  Anesthesia Plan Comments:         Anesthesia Quick Evaluation

## 2018-05-30 NOTE — Anesthesia Postprocedure Evaluation (Signed)
Anesthesia Post Note  Patient: MANDISA PERSINGER  Procedure(s) Performed: HYSTEROSCOPY WITH MYOSURE ENDOMETRIAL RESECTION AND NOVASURE (N/A ) LAPAROSCOPIC TUBAL LIGATION (Bilateral ) LAPAROSCOPY DIAGNOSTIC (N/A )  Patient location during evaluation: PACU Anesthesia Type: General Level of consciousness: awake and alert and oriented Pain management: pain level controlled Vital Signs Assessment: post-procedure vital signs reviewed and stable Respiratory status: spontaneous breathing, nonlabored ventilation and respiratory function stable Cardiovascular status: blood pressure returned to baseline and stable Postop Assessment: no signs of nausea or vomiting Anesthetic complications: no     Last Vitals:  Vitals:   05/30/18 1051 05/30/18 1102  BP: 122/67 140/80  Pulse: 91 (!) 104  Resp: 12 18  Temp: 36.7 C 36.7 C  SpO2: 98% 100%    Last Pain:  Vitals:   05/30/18 1102  TempSrc: Temporal  PainSc: 5                  Peggy Loge

## 2018-05-30 NOTE — Anesthesia Procedure Notes (Signed)
Procedure Name: Intubation Date/Time: 05/30/2018 8:03 AM Performed by: Doreen Salvage, CRNA Pre-anesthesia Checklist: Patient identified, Patient being monitored, Timeout performed, Emergency Drugs available and Suction available Patient Re-evaluated:Patient Re-evaluated prior to induction Oxygen Delivery Method: Circle system utilized Preoxygenation: Pre-oxygenation with 100% oxygen Induction Type: IV induction Ventilation: Mask ventilation without difficulty Laryngoscope Size: Mac and 3 Grade View: Grade I Tube type: Oral Tube size: 7.0 mm Number of attempts: 1 Airway Equipment and Method: Stylet Placement Confirmation: ETT inserted through vocal cords under direct vision,  positive ETCO2 and breath sounds checked- equal and bilateral Secured at: 21 cm Tube secured with: Tape Dental Injury: Teeth and Oropharynx as per pre-operative assessment

## 2018-05-30 NOTE — Interval H&P Note (Signed)
History and Physical Interval Note:  05/30/2018 7:52 AM  Lindsey Marsh  has presented today for surgery, with the diagnosis of menometrorrhagia  The various methods of treatment have been discussed with the patient and family. After consideration of risks, benefits and other options for treatment, the patient has consented to  Procedure(s): HYSTEROSCOPY WITH NOVASURE (N/A) LAPAROSCOPIC TUBAL LIGATION (Bilateral) LAPAROSCOPY DIAGNOSTIC (N/A) as a surgical intervention .  She has asked that if, for any reason, we can't complete the sterilization and ablation, we perform a hysterectomy. Though unlikely, I have confirmed that this is a possibility. The patient's history has been reviewed, patient examined, no change in status, stable for surgery.  I have reviewed the patient's chart and labs.  Questions were answered to the patient's satisfaction.     Benjaman Kindler

## 2018-05-30 NOTE — Anesthesia Post-op Follow-up Note (Signed)
Anesthesia QCDR form completed.        

## 2018-05-30 NOTE — Progress Notes (Signed)
Pt s family states pt still hurting , pt has been to bathroom and states she is ok. Dr Leafy Ro in to see pt and then pts mom states pt wants to stay , Dr Leafy Ro made aware and orders to be placed per her.

## 2018-05-30 NOTE — Discharge Instructions (Addendum)
AMBULATORY SURGERY  °DISCHARGE INSTRUCTIONS ° ° °1) The drugs that you were given will stay in your system until tomorrow so for the next 24 hours you should not: ° °A) Drive an automobile °B) Make any legal decisions °C) Drink any alcoholic beverage ° ° °2) You may resume regular meals tomorrow.  Today it is better to start with liquids and gradually work up to solid foods. ° °You may eat anything you prefer, but it is better to start with liquids, then soup and crackers, and gradually work up to solid foods. ° ° °3) Please notify your doctor immediately if you have any unusual bleeding, trouble breathing, redness and pain at the surgery site, drainage, fever, or pain not relieved by medication. ° ° ° °4) Additional Instructions: ° ° ° ° ° ° ° °Please contact your physician with any problems or Same Day Surgery at [phone], Monday through Friday 6 am to 4 pm, or [place] [facility] [place] Main number at [phone]. °

## 2018-05-30 NOTE — Progress Notes (Signed)
   05/30/18 0800  Clinical Encounter Type  Visited With Patient;Family  Visit Type Initial;Spiritual support  Spiritual Encounters  Spiritual Needs Emotional;Prayer     05/30/18 0800  Clinical Encounter Type  Visited With Patient;Family  Visit Type Initial;Spiritual support  Spiritual Encounters  Spiritual Needs Emotional;Prayer   Chaplain engaged patient, who discussed prayer and her faith. During the conversation, the patient's family arrived and her mother entered into a time of prayer. Chaplain blessed the patient.

## 2018-05-30 NOTE — Transfer of Care (Signed)
Immediate Anesthesia Transfer of Care Note  Patient: Lindsey Marsh  Procedure(s) Performed: Procedure(s): HYSTEROSCOPY WITH MYOSURE ENDOMETRIAL RESECTION AND NOVASURE (N/A) LAPAROSCOPIC TUBAL LIGATION (Bilateral) LAPAROSCOPY DIAGNOSTIC (N/A)  Patient Location: PACU  Anesthesia Type:General  Level of Consciousness: sedated  Airway & Oxygen Therapy: Patient Spontanous Breathing and Patient connected to face mask oxygen  Post-op Assessment: Report given to RN and Post -op Vital signs reviewed and stable  Post vital signs: Reviewed and stable  Last Vitals:  Vitals:   05/30/18 0628 05/30/18 0951  BP: (!) 148/98 (!) 113/50  Pulse: (!) 101 (!) 103  Resp: 15 19  Temp: 36.6 C 36.5 C  SpO2: 085% 694%    Complications: No apparent anesthesia complications

## 2018-05-31 DIAGNOSIS — Z302 Encounter for sterilization: Secondary | ICD-10-CM | POA: Diagnosis not present

## 2018-05-31 LAB — CBC
HEMATOCRIT: 36.4 % (ref 35.0–47.0)
HEMOGLOBIN: 12.2 g/dL (ref 12.0–16.0)
MCH: 31.8 pg (ref 26.0–34.0)
MCHC: 33.4 g/dL (ref 32.0–36.0)
MCV: 95.2 fL (ref 80.0–100.0)
Platelets: 253 10*3/uL (ref 150–440)
RBC: 3.83 MIL/uL (ref 3.80–5.20)
RDW: 13.6 % (ref 11.5–14.5)
WBC: 11.6 10*3/uL — AB (ref 3.6–11.0)

## 2018-05-31 LAB — BASIC METABOLIC PANEL
Anion gap: 4 — ABNORMAL LOW (ref 5–15)
BUN: 6 mg/dL (ref 6–20)
CALCIUM: 8.3 mg/dL — AB (ref 8.9–10.3)
CO2: 25 mmol/L (ref 22–32)
Chloride: 111 mmol/L (ref 98–111)
Creatinine, Ser: 0.54 mg/dL (ref 0.44–1.00)
GFR calc non Af Amer: >60 mL/min (ref >60)
Glucose, Bld: 106 mg/dL — ABNORMAL HIGH (ref 70–99)
POTASSIUM: 3.6 mmol/L (ref 3.5–5.1)
Sodium: 140 mmol/L (ref 135–145)

## 2018-05-31 LAB — SURGICAL PATHOLOGY

## 2018-05-31 MED ORDER — ONDANSETRON HCL 4 MG PO TABS: 4.0000 mg | ORAL_TABLET | Freq: Four times a day (QID) | ORAL | 0 refills | Status: AC | PRN

## 2018-05-31 MED ORDER — ACETAMINOPHEN 500 MG PO TABS
1000.0000 mg | ORAL_TABLET | Freq: Four times a day (QID) | ORAL | Status: DC
  Administered 2018-05-31: 1000 mg via ORAL
  Filled 2018-05-31: qty 2

## 2018-05-31 MED ORDER — DOCUSATE SODIUM 100 MG PO CAPS: 100.0000 mg | ORAL_CAPSULE | Freq: Two times a day (BID) | ORAL | 0 refills | Status: AC

## 2018-05-31 MED ORDER — OXYCODONE HCL 5 MG PO TABS: 5.0000 mg | ORAL_TABLET | ORAL | 0 refills | Status: AC | PRN

## 2018-05-31 MED ORDER — IBUPROFEN 800 MG PO TABS
800.0000 mg | ORAL_TABLET | Freq: Three times a day (TID) | ORAL | Status: DC
  Administered 2018-05-31: 800 mg via ORAL
  Filled 2018-05-31: qty 1

## 2018-05-31 MED ORDER — OXYCODONE HCL 5 MG PO TABS: 5.0000 mg | ORAL_TABLET | ORAL | Status: DC | PRN

## 2018-05-31 MED ORDER — SIMETHICONE 80 MG PO CHEW: 80.0000 mg | CHEWABLE_TABLET | Freq: Four times a day (QID) | ORAL | 0 refills | Status: AC | PRN

## 2018-05-31 MED ORDER — GABAPENTIN 300 MG PO CAPS: 900.0000 mg | ORAL_CAPSULE | Freq: Every day | ORAL | Status: DC

## 2018-05-31 NOTE — Progress Notes (Signed)
Patient discharged home with significant other. Discharge instructions and new prescriptions given and reviewed with patient. Patient verbalized understanding. Escorted out by auxillary.

## 2018-05-31 NOTE — Discharge Summary (Signed)
1 Day Post-Op       Procedure(s): HYSTEROSCOPY WITH MYOSURE ENDOMETRIAL RESECTION AND NOVASURE (N/A) LAPAROSCOPIC TUBAL LIGATION (Bilateral) LAPAROSCOPY DIAGNOSTIC (N/A) Subjective: The patient is doing well.  No nausea or vomiting. Pain is adequately controlled.  Objective: Vital signs in last 24 hours: Temp:  [97.5 F (36.4 C)-98.6 F (37 C)] 98.1 F (36.7 C) (10/08 0747) Pulse Rate:  [65-104] 65 (10/08 0747) Resp:  [11-19] 18 (10/08 0747) BP: (91-140)/(50-80) 91/66 (10/08 0747) SpO2:  [96 %-100 %] 100 % (10/08 0747)  Intake/Output  Intake/Output Summary (Last 24 hours) at 05/31/2018 0831 Last data filed at 05/31/2018 0351 Gross per 24 hour  Intake 2256.9 ml  Output 1002 ml  Net 1254.9 ml    Physical Exam:  General: Alert and oriented. CV: RRR Lungs: Clear bilaterally. GI: Soft, Nondistended. Incisions: Clean and dry. Urine: Clear, Foley in place Extremities: Nontender, no erythema, no edema.  Lab Results: Recent Labs    05/31/18 0618  HGB 12.2  HCT 36.4  WBC 11.6*  PLT 253                 Results for orders placed or performed during the hospital encounter of 05/30/18 (from the past 24 hour(s))  CBC     Status: Abnormal   Collection Time: 05/31/18  6:18 AM  Result Value Ref Range   WBC 11.6 (H) 3.6 - 11.0 K/uL   RBC 3.83 3.80 - 5.20 MIL/uL   Hemoglobin 12.2 12.0 - 16.0 g/dL   HCT 36.4 35.0 - 47.0 %   MCV 95.2 80.0 - 100.0 fL   MCH 31.8 26.0 - 34.0 pg   MCHC 33.4 32.0 - 36.0 g/dL   RDW 13.6 11.5 - 14.5 %   Platelets 253 150 - 440 K/uL  Basic metabolic panel     Status: Abnormal   Collection Time: 05/31/18  6:18 AM  Result Value Ref Range   Sodium 140 135 - 145 mmol/L   Potassium 3.6 3.5 - 5.1 mmol/L   Chloride 111 98 - 111 mmol/L   CO2 25 22 - 32 mmol/L   Glucose, Bld 106 (H) 70 - 99 mg/dL   BUN 6 6 - 20 mg/dL   Creatinine, Ser 0.54 0.44 - 1.00 mg/dL   Calcium 8.3 (L) 8.9 - 10.3 mg/dL   GFR calc non Af Amer >60 >60 mL/min   GFR calc Af Amer  >60 >60 mL/min   Anion gap 4 (L) 5 - 15    Assessment/Plan: 1 Day Post-Op       Procedure(s): HYSTEROSCOPY WITH MYOSURE ENDOMETRIAL RESECTION AND NOVASURE (N/A) LAPAROSCOPIC TUBAL LIGATION (Bilateral) LAPAROSCOPY DIAGNOSTIC (N/A)  1) Ambulate, Incentive spirometry 2) Advance diet as tolerated 3) Transition to oral pain medication 4) Discharge home today anticipated    Benjaman Kindler, MD   LOS: 0 days   Benjaman Kindler 05/31/2018, 8:31 AM

## 2018-10-04 ENCOUNTER — Encounter: Attending: Family | Primary: Family

## 2018-10-04 ENCOUNTER — Ambulatory Visit: Admit: 2018-10-04 | Discharge: 2018-10-04 | Payer: PRIVATE HEALTH INSURANCE | Primary: Family

## 2018-10-04 DIAGNOSIS — R42 Dizziness and giddiness: Secondary | ICD-10-CM

## 2018-10-04 MED ORDER — MECLIZINE 25 MG TAB
25 mg | ORAL_TABLET | Freq: Three times a day (TID) | ORAL | 0 refills | Status: AC | PRN
Start: 2018-10-04 — End: ?

## 2018-10-04 MED ORDER — FLUTICASONE 50 MCG/ACTUATION NASAL SPRAY, SUSP
50 mcg/actuation | Freq: Every day | NASAL | 0 refills | Status: AC
Start: 2018-10-04 — End: 2018-10-18

## 2018-10-04 NOTE — Progress Notes (Signed)
Solei presents with intermittent dizziness described as the room spinning since this morning, worse while at work. Also reports right ear pressure and pain. Denies SOB, CP, palpitations.    The history is provided by the patient.        History reviewed. No pertinent past medical history.     History reviewed. No pertinent surgical history.      History reviewed. No pertinent family history.     Social History     Socioeconomic History   ??? Marital status: DIVORCED     Spouse name: Not on file   ??? Number of children: Not on file   ??? Years of education: Not on file   ??? Highest education level: Not on file   Occupational History   ??? Not on file   Social Needs   ??? Financial resource strain: Not on file   ??? Food insecurity:     Worry: Not on file     Inability: Not on file   ??? Transportation needs:     Medical: Not on file     Non-medical: Not on file   Tobacco Use   ??? Smoking status: Never Smoker   ??? Smokeless tobacco: Never Used   Substance and Sexual Activity   ??? Alcohol use: Not on file   ??? Drug use: Not on file   ??? Sexual activity: Not on file   Lifestyle   ??? Physical activity:     Days per week: Not on file     Minutes per session: Not on file   ??? Stress: Not on file   Relationships   ??? Social connections:     Talks on phone: Not on file     Gets together: Not on file     Attends religious service: Not on file     Active member of club or organization: Not on file     Attends meetings of clubs or organizations: Not on file     Relationship status: Not on file   ??? Intimate partner violence:     Fear of current or ex partner: Not on file     Emotionally abused: Not on file     Physically abused: Not on file     Forced sexual activity: Not on file   Other Topics Concern   ??? Not on file   Social History Narrative   ??? Not on file                ALLERGIES: Percocet [oxycodone-acetaminophen] and Sulfa (sulfonamide antibiotics)    Review of Systems   Constitutional: Negative for activity change, appetite change, chills and  fever.   HENT: Positive for ear pain. Negative for congestion, rhinorrhea, sinus pressure, sinus pain, sore throat and trouble swallowing.    Eyes: Negative for visual disturbance.   Respiratory: Negative for cough, shortness of breath and wheezing.    Cardiovascular: Negative for chest pain and palpitations.   Gastrointestinal: Negative for nausea and vomiting.   Musculoskeletal: Negative for myalgias.   Neurological: Positive for dizziness. Negative for headaches.   Hematological: Negative for adenopathy.       Vitals:    10/04/18 1713 10/04/18 1739   BP: (!) 160/94 135/79   Pulse: 79    Resp: 16    Temp: 98.3 ??F (36.8 ??C)    SpO2: 99%    Weight: 194 lb (88 kg)    Height: 5\' 6"  (1.676 m)  Physical Exam  Vitals signs and nursing note reviewed.   Constitutional:       General: She is not in acute distress.     Appearance: She is well-developed. She is not diaphoretic.   HENT:      Right Ear: Ear canal and external ear normal. A middle ear effusion is present. Tympanic membrane is bulging. Tympanic membrane is not erythematous.      Left Ear: Tympanic membrane, ear canal and external ear normal.      Nose: Nose normal.      Right Sinus: No maxillary sinus tenderness or frontal sinus tenderness.      Left Sinus: No maxillary sinus tenderness or frontal sinus tenderness.      Mouth/Throat:      Pharynx: No oropharyngeal exudate or posterior oropharyngeal erythema.      Tonsils: No tonsillar abscesses.   Cardiovascular:      Rate and Rhythm: Normal rate and regular rhythm.      Heart sounds: Normal heart sounds.   Pulmonary:      Effort: Pulmonary effort is normal. No respiratory distress.      Breath sounds: Normal breath sounds. No wheezing or rales.   Lymphadenopathy:      Cervical: No cervical adenopathy.   Neurological:      General: No focal deficit present.      Mental Status: She is alert and oriented to person, place, and time.      Cranial Nerves: No cranial nerve deficit.       Sensory: No sensory deficit.      Motor: No weakness.      Coordination: Coordination normal.      Gait: Gait normal.   Psychiatric:         Behavior: Behavior normal.         Thought Content: Thought content normal.         Judgment: Judgment normal.         MDM    ICD-10-CM ICD-9-CM   1. Dizziness R42 780.4   2. Non-recurrent acute serous otitis media of right ear H65.01 381.01       Orders Placed This Encounter   ??? fluticasone propionate (FLONASE) 50 mcg/actuation nasal spray     Sig: 2 Sprays by Both Nostrils route daily for 14 days.     Dispense:  1 Bottle     Refill:  0   ??? meclizine (ANTIVERT) 25 mg tablet     Sig: Take 1 Tab by mouth three (3) times daily as needed for Dizziness.     Dispense:  30 Tab     Refill:  0      Fluids  The patient is to follow up with PCP.     If signs and symptoms become worse the pt is to go to the ER.       Marland Kitchen  Procedures

## 2018-10-04 NOTE — Patient Instructions (Addendum)
Dizziness: Care Instructions  Your Care Instructions  Dizziness is the feeling of unsteadiness or fuzziness in your head. It is different than having vertigo, which is a feeling that the room is spinning or that you are moving or falling. It is also different from lightheadedness, which is the feeling that you are about to faint.  It can be hard to know what causes dizziness. Some people feel dizzy when they have migraine headaches. Sometimes bouts of flu can make you feel dizzy. Some medical conditions, such as heart problems or high blood pressure, can make you feel dizzy. Many medicines can cause dizziness, including medicines for high blood pressure, pain, or anxiety.  If a medicine causes your symptoms, your doctor may recommend that you stop or change the medicine. If it is a problem with your heart, you may need medicine to help your heart work better. If there is no clear reason for your symptoms, your doctor may suggest watching and waiting for a while to see if the dizziness goes away on its own.  Follow-up care is a key part of your treatment and safety. Be sure to make and go to all appointments, and call your doctor if you are having problems. It's also a good idea to know your test results and keep a list of the medicines you take.  How can you care for yourself at home?  ?? If your doctor recommends or prescribes medicine, take it exactly as directed. Call your doctor if you think you are having a problem with your medicine.  ?? Do not drive while you feel dizzy.  ?? Try to prevent falls. Steps you can take include:  ? Using nonskid mats, adding grab bars near the tub, and using night-lights.  ? Clearing your home so that walkways are free of anything you might trip on.  ? Letting family and friends know that you have been feeling dizzy. This will help them know how to help you.  When should you call for help?  Call 911 anytime you think you may need emergency care. For example, call if:   ?? ?? You passed out (lost consciousness).   ?? ?? You have dizziness along with symptoms of a heart attack. These may include:  ? Chest pain or pressure, or a strange feeling in the chest.  ? Sweating.  ? Shortness of breath.  ? Nausea or vomiting.  ? Pain, pressure, or a strange feeling in the back, neck, jaw, or upper belly or in one or both shoulders or arms.  ? Lightheadedness or sudden weakness.  ? A fast or irregular heartbeat.   ?? ?? You have symptoms of a stroke. These may include:  ? Sudden numbness, tingling, weakness, or loss of movement in your face, arm, or leg, especially on only one side of your body.  ? Sudden vision changes.  ? Sudden trouble speaking.  ? Sudden confusion or trouble understanding simple statements.  ? Sudden problems with walking or balance.  ? A sudden, severe headache that is different from past headaches.   ??Call your doctor now or seek immediate medical care if:  ?? ?? You feel dizzy and have a fever, headache, or ringing in your ears.   ?? ?? You have new or increased nausea and vomiting.   ?? ?? Your dizziness does not go away or comes back.   ??Watch closely for changes in your health, and be sure to contact your doctor if:  ?? ?? You do  not get better as expected.   Where can you learn more?  Go to http://www.healthwise.net/[employer].  Enter Q823 in the search box to learn more about "Dizziness: Care Instructions."  Current as of: February 16, 2018  Content Version: 12.2  ?? 2006-2019 [employer]. Care instructions adapted under license by [employer] (which disclaims liability or warranty for this information). If you have questions about a medical condition or this instruction, always ask your healthcare professional. [employer] disclaims any warranty or liability for your use of this information.         Middle Ear Fluid: Care Instructions  Your Care Instructions    Fluid often builds up inside the ear during a cold or allergies. Usually  the fluid drains away, but sometimes a small tube in the ear, called the eustachian tube, stays blocked for months.  Symptoms of fluid buildup may include:  ?? Popping, ringing, or a feeling of fullness or pressure in the ear.  ?? Trouble hearing.  ?? Balance problems and dizziness.  In most cases, you can treat yourself at home.  Follow-up care is a key part of your treatment and safety. Be sure to make and go to all appointments, and call your doctor if you are having problems. It's also a good idea to know your test results and keep a list of the medicines you take.  How can you care for yourself at home?  ?? In most cases, the fluid clears up within a few months without treatment. You may need more tests if the fluid does not clear up after 3 months.  ?? If your doctor prescribed antibiotics, take them as directed. Do not stop taking them just because you feel better. You need to take the full course of antibiotics.  When should you call for help?  Call your doctor now or seek immediate medical care if:  ?? ?? You have symptoms of infection, such as:  ? Increased pain, swelling, warmth, or redness.  ? Pus draining from the area.  ? A fever.   ??Watch closely for changes in your health, and be sure to contact your doctor if:  ?? ?? You notice changes in hearing.   ?? ?? You do not get better as expected.   Where can you learn more?  Go to http://www.healthwise.net/[employer].  Enter S930 in the search box to learn more about "Middle Ear Fluid: Care Instructions."  Current as of: June 13, 2017  Content Version: 12.2  ?? 2006-2019 [employer]. Care instructions adapted under license by [employer] (which disclaims liability or warranty for this information). If you have questions about a medical condition or this instruction, always ask your healthcare professional. [employer] disclaims any warranty or liability for your use of this information.

## 2018-10-04 NOTE — Addendum Note (Signed)
Addended by: Holly Bodily A on: 10/04/2018 05:44 PM     Modules accepted: Level of Service

## 2018-10-07 ENCOUNTER — Encounter: Admit: 2018-10-07 | Discharge: 2018-10-07 | Payer: PRIVATE HEALTH INSURANCE | Attending: Family | Primary: Family

## 2018-10-31 ENCOUNTER — Encounter: Admit: 2018-10-31 | Discharge: 2018-10-31 | Payer: PRIVATE HEALTH INSURANCE | Attending: Family | Primary: Family

## 2018-11-25 ENCOUNTER — Encounter: Admit: 2018-11-25 | Discharge: 2018-11-25 | Payer: PRIVATE HEALTH INSURANCE | Attending: Family | Primary: Family

## 2019-10-16 ENCOUNTER — Encounter: Payer: PRIVATE HEALTH INSURANCE | Attending: Family | Primary: Family

## 2019-11-13 ENCOUNTER — Ambulatory Visit: Payer: Medicare Other | Attending: Internal Medicine

## 2019-11-13 DIAGNOSIS — Z23 Encounter for immunization: Secondary | ICD-10-CM

## 2019-11-13 NOTE — Progress Notes (Signed)
   Covid-19 Vaccination Clinic  Name:  Lindsey Marsh    MRN: [id number] DOB: 09/25/1970  11/13/2019  [name] was observed post Covid-19 immunization for 15 minutes without incident. She was provided with Vaccine Information Sheet and instruction to access the V-Safe system.   [name] was instructed to call 911 with any severe reactions post vaccine: Marland Kitchen Difficulty breathing  . Swelling of face and throat  . A fast heartbeat  . A bad rash all over body  . Dizziness and weakness   Immunizations Administered    Name Date Dose VIS Date Route   Pfizer COVID-19 Vaccine 11/13/2019 10:01 AM 0.3 mL 08/04/2019 Intramuscular   Manufacturer: [place]   Lot: [id number]   [place]: [id number]

## 2019-11-28 ENCOUNTER — Encounter: Payer: PRIVATE HEALTH INSURANCE | Attending: Family | Primary: Family

## 2019-12-04 ENCOUNTER — Ambulatory Visit: Payer: Medicare Other | Attending: Internal Medicine

## 2019-12-04 DIAGNOSIS — Z23 Encounter for immunization: Secondary | ICD-10-CM

## 2019-12-04 NOTE — Progress Notes (Signed)
   Covid-19 Vaccination Clinic  Name:  Lindsey Marsh    MRN: [id number] DOB: [birth year]  12/04/2019  [name] was observed post Covid-19 immunization for 15 minutes without incident. She was provided with Vaccine Information Sheet and instruction to access the V-Safe system.   [name] was instructed to call 911 with any severe reactions post vaccine: Marland Kitchen Difficulty breathing  . Swelling of face and throat  . A fast heartbeat  . A bad rash all over body  . Dizziness and weakness   Immunizations Administered    Name Date Dose VIS Date Route   Pfizer COVID-19 Vaccine 12/04/2019  9:44 AM 0.3 mL 08/04/2019 Intramuscular   Manufacturer: [place]   Lot: [id number]   [place]: [id number]

## 2019-12-30 ENCOUNTER — Ambulatory Visit: Payer: Medicare Other
